# Patient Record
Sex: Female | Born: 1960 | Race: White | Hispanic: No | Smoking: Former smoker
Health system: Southern US, Community
[De-identification: ages and names within clinical notes are randomized; demographics above are authoritative.]

## PROBLEM LIST (undated history)

## (undated) DIAGNOSIS — M5430 Sciatica, unspecified side: Secondary | ICD-10-CM

## (undated) DIAGNOSIS — F419 Anxiety disorder, unspecified: Secondary | ICD-10-CM

## (undated) HISTORY — PX: CARPAL TUNNEL RELEASE: SHX101

## (undated) HISTORY — DX: Anxiety disorder, unspecified: F41.9

## (undated) HISTORY — PX: LIPOMA EXCISION: SHX5283

## (undated) HISTORY — PX: CHOLECYSTECTOMY: SHX55

---

## 2001-05-30 ENCOUNTER — Other Ambulatory Visit: Admission: RE | Admit: 2001-05-30 | Discharge: 2001-05-30 | Payer: Self-pay | Admitting: Internal Medicine

## 2001-06-04 ENCOUNTER — Ambulatory Visit (HOSPITAL_COMMUNITY): Admission: RE | Admit: 2001-06-04 | Discharge: 2001-06-04 | Payer: Self-pay | Admitting: Internal Medicine

## 2001-06-04 ENCOUNTER — Encounter: Payer: Self-pay | Admitting: Internal Medicine

## 2004-02-12 ENCOUNTER — Emergency Department (HOSPITAL_COMMUNITY): Admission: EM | Admit: 2004-02-12 | Discharge: 2004-02-12 | Payer: Self-pay | Admitting: *Deleted

## 2004-07-19 ENCOUNTER — Other Ambulatory Visit: Admission: RE | Admit: 2004-07-19 | Discharge: 2004-07-19 | Payer: Self-pay | Admitting: Internal Medicine

## 2004-07-28 ENCOUNTER — Ambulatory Visit (HOSPITAL_COMMUNITY): Admission: RE | Admit: 2004-07-28 | Discharge: 2004-07-28 | Payer: Self-pay | Admitting: Internal Medicine

## 2004-12-06 ENCOUNTER — Ambulatory Visit (HOSPITAL_COMMUNITY): Admission: RE | Admit: 2004-12-06 | Discharge: 2004-12-06 | Payer: Self-pay | Admitting: Otolaryngology

## 2005-12-26 ENCOUNTER — Ambulatory Visit: Admission: RE | Admit: 2005-12-26 | Discharge: 2005-12-26 | Payer: Self-pay | Admitting: Gynecologic Oncology

## 2006-01-09 ENCOUNTER — Ambulatory Visit (HOSPITAL_COMMUNITY): Admission: RE | Admit: 2006-01-09 | Discharge: 2006-01-09 | Payer: Self-pay | Admitting: Gynecologic Oncology

## 2006-01-30 ENCOUNTER — Ambulatory Visit: Admission: RE | Admit: 2006-01-30 | Discharge: 2006-01-30 | Payer: Self-pay | Admitting: Gynecologic Oncology

## 2006-12-25 ENCOUNTER — Inpatient Hospital Stay (HOSPITAL_COMMUNITY): Admission: EM | Admit: 2006-12-25 | Discharge: 2006-12-27 | Payer: Self-pay | Admitting: Emergency Medicine

## 2006-12-25 ENCOUNTER — Ambulatory Visit: Payer: Self-pay | Admitting: Cardiology

## 2006-12-26 ENCOUNTER — Ambulatory Visit: Payer: Self-pay | Admitting: Cardiology

## 2007-01-23 ENCOUNTER — Encounter (HOSPITAL_COMMUNITY): Admission: RE | Admit: 2007-01-23 | Discharge: 2007-02-22 | Payer: Self-pay | Admitting: Internal Medicine

## 2007-02-02 ENCOUNTER — Emergency Department (HOSPITAL_COMMUNITY): Admission: EM | Admit: 2007-02-02 | Discharge: 2007-02-02 | Payer: Self-pay | Admitting: Emergency Medicine

## 2007-02-11 ENCOUNTER — Ambulatory Visit: Payer: Self-pay | Admitting: Cardiology

## 2007-03-01 ENCOUNTER — Ambulatory Visit: Payer: Self-pay | Admitting: Internal Medicine

## 2007-03-07 ENCOUNTER — Ambulatory Visit (HOSPITAL_COMMUNITY): Admission: RE | Admit: 2007-03-07 | Discharge: 2007-03-07 | Payer: Self-pay | Admitting: Internal Medicine

## 2007-03-07 ENCOUNTER — Ambulatory Visit: Payer: Self-pay | Admitting: Internal Medicine

## 2007-10-10 ENCOUNTER — Ambulatory Visit: Payer: Self-pay | Admitting: Cardiology

## 2008-02-10 ENCOUNTER — Ambulatory Visit: Payer: Self-pay | Admitting: Internal Medicine

## 2008-03-02 ENCOUNTER — Encounter: Payer: Self-pay | Admitting: Internal Medicine

## 2008-03-02 ENCOUNTER — Ambulatory Visit: Payer: Self-pay | Admitting: Internal Medicine

## 2008-03-02 ENCOUNTER — Ambulatory Visit (HOSPITAL_COMMUNITY): Admission: RE | Admit: 2008-03-02 | Discharge: 2008-03-02 | Payer: Self-pay | Admitting: Internal Medicine

## 2009-05-01 IMAGING — CT CT ANGIO CHEST
2 of 5 series · 18 of 32 positions shown · non-contrast
Comparison: none

HISTORY: Chest pain, dyspnea, question aortic dissection

[Series 9: lung · axial · 0.69mm/px · z∈[-200,-161]mm · 2 of 88 slices shown]
[im 44/88  mediastinal]
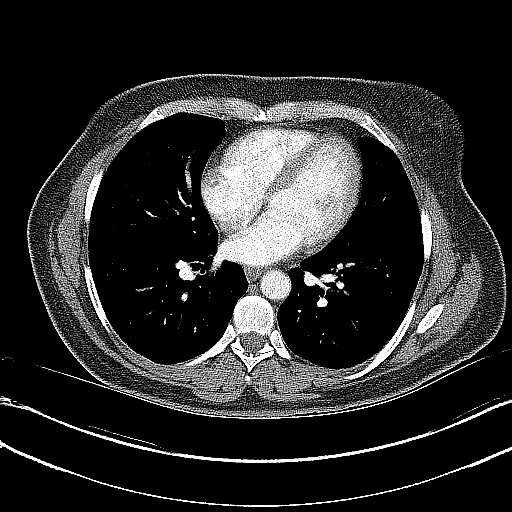
[im 57/88  mediastinal]
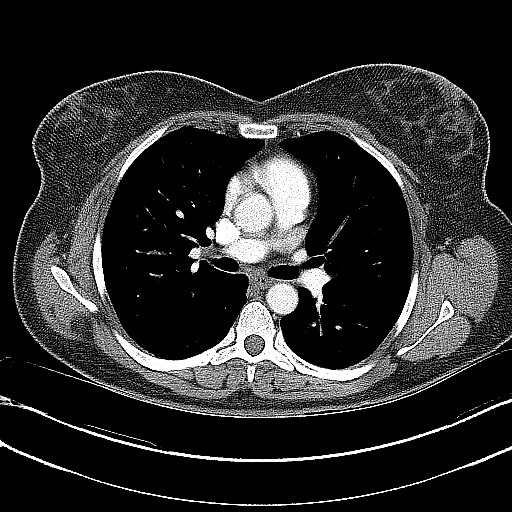

[Series 13: pacs thin · axial · 0.69mm/px · z∈[-500,-86]mm · 16 of 593 slices shown]
[im 38/593  lung]
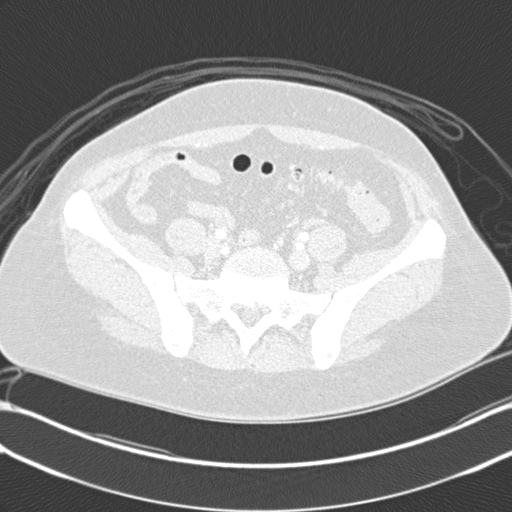
[im 75/593  mediastinal]
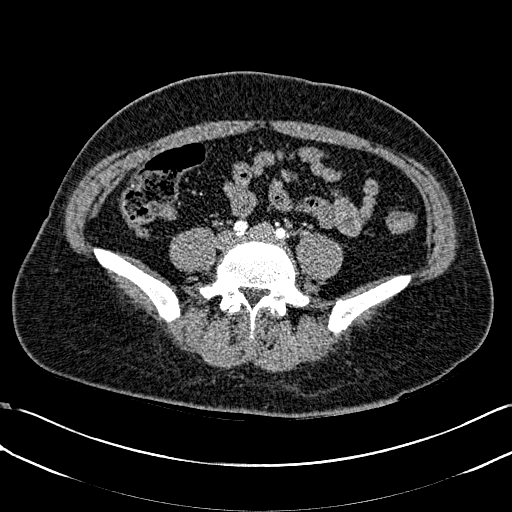
[im 112/593  lung]
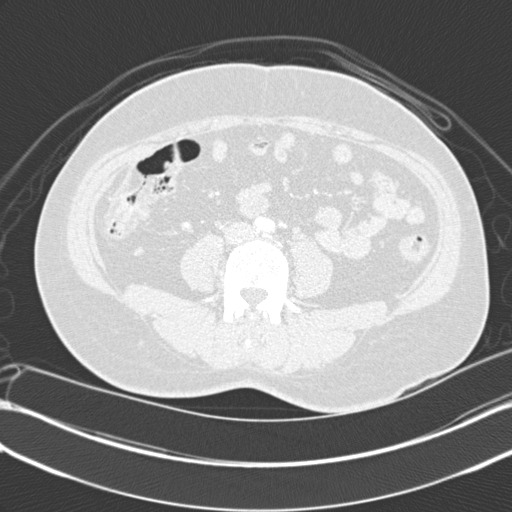
[im 149/593  mediastinal]
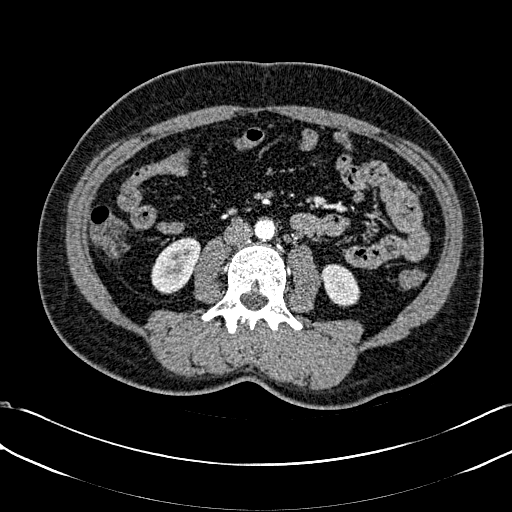
[im 186/593  lung]
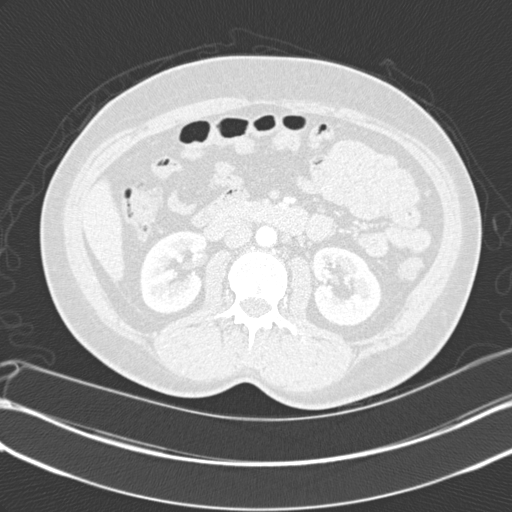
[im 223/593  mediastinal]
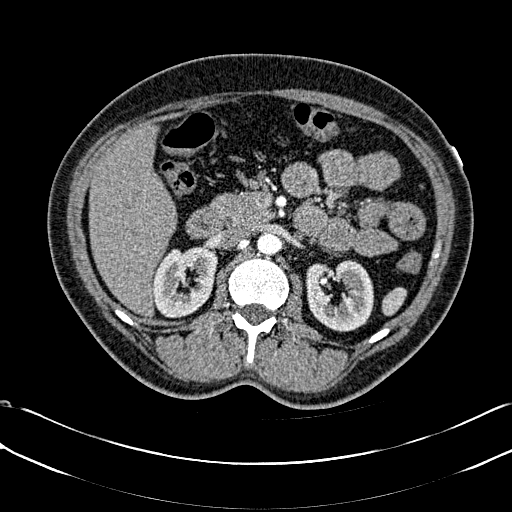
[im 260/593  lung]
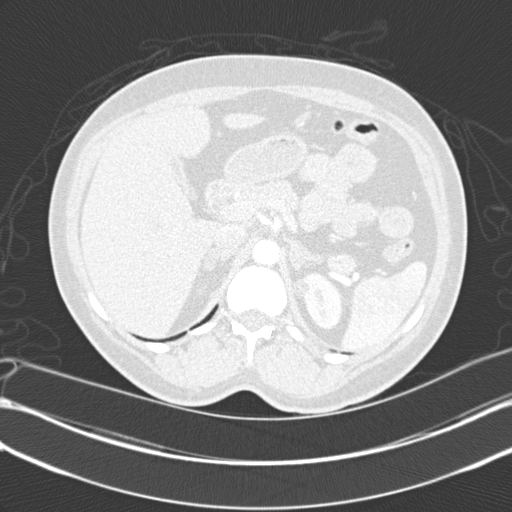
[im 297/593  mediastinal]
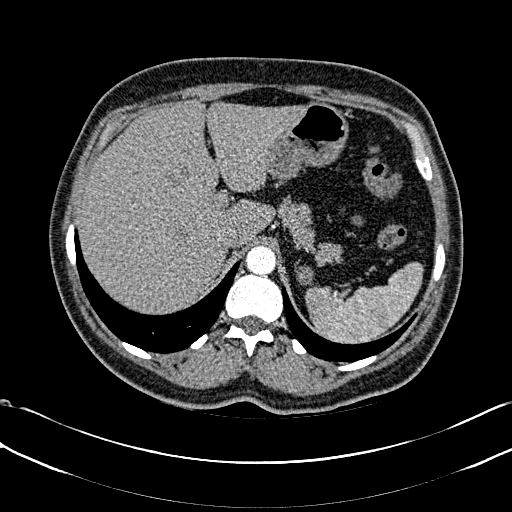
[im 334/593  lung]
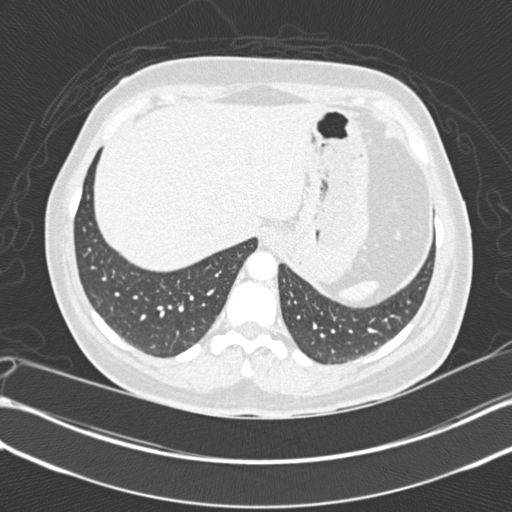
[im 371/593  mediastinal]
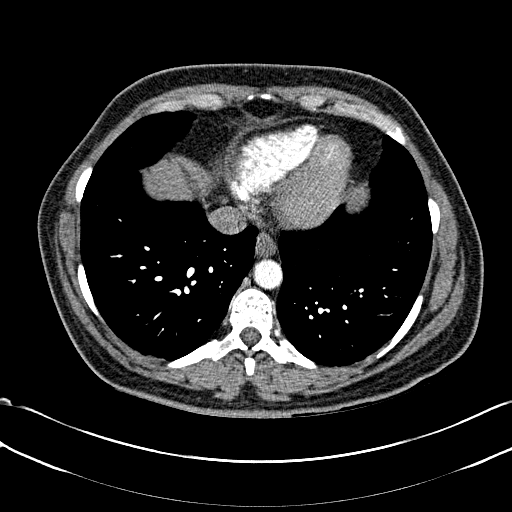
[im 408/593  lung]
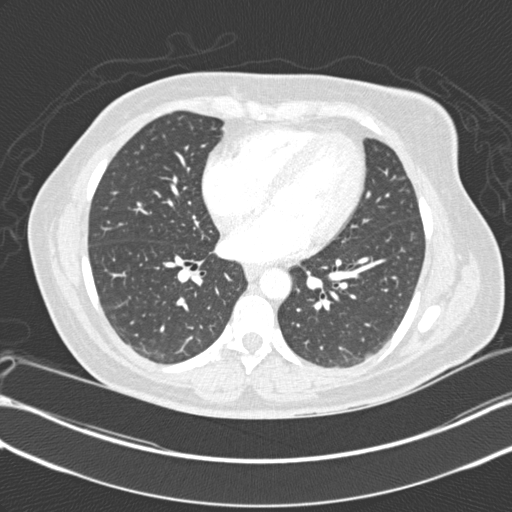
[im 445/593  mediastinal]
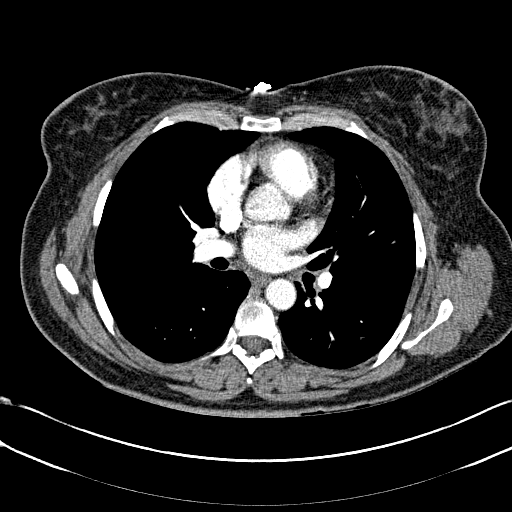
[im 461/593  lung]
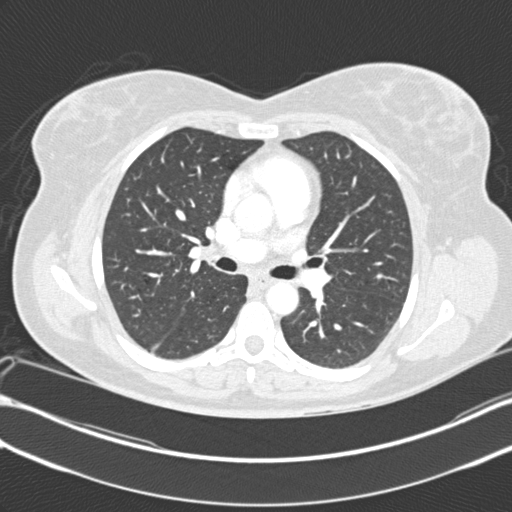
[im 482/593  mediastinal]
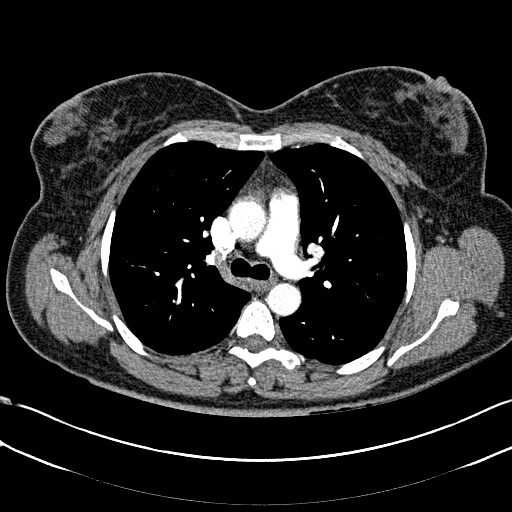
[im 519/593  lung]
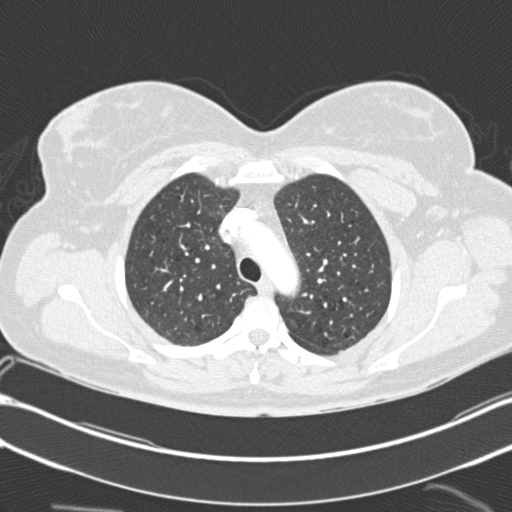
[im 556/593  mediastinal]
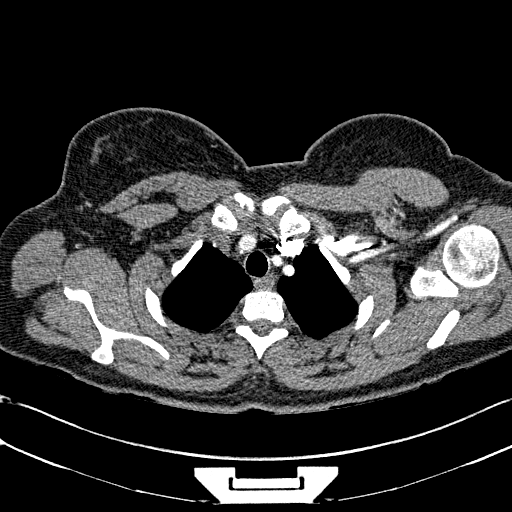

[18 of 32 positions shown; findings below may reference images not displayed]

CT ANGIO CHEST AND ABDOMEN WITH AND WITHOUT CONTRAST:

Multidetector helical CT imaging of chest and abdomen performed using aortic
dissection protocol.
Exam utilized 120 cc Qmnipaque-FVZ.
Examination includes pre- and postcontrast images as well as postcontrast
sagittal and coronal images.
No prior exam for comparison.

CT ANGIO CHEST:

No high attenuation within the aortic wall on precontrast images.
Normal aortic opacification on postcontrast study without aortic aneurysm or
dissection.
Pulmonary arteries patent without pulmonary embolism.
Minimal stranding anterior mediastinum likely related to residual thymus.
No thoracic adenopathy.
Minimal dependent atelectasis and lungs.
Lungs otherwise clear.
Bones unremarkable.
IMPRESSION: No acute thoracic abnormalities.

CT ANGIO ABDOMEN:

No intramural hematoma seen on precontrast images.
Mild scattered atherosclerotic calcification abdominal aorta.
Aorta patent and normal in caliber on postcontrast images.
No evidence of aortic aneurysm or dissection.
Origins of the celiac, superior mesenteric, inferior mesenteric, and renal
arteries appear patent.
Single renal arteries seen bilaterally.
Tiny cyst upper pole left kidney 12 mm diameter image 91.
Slightly thickened adrenal glands bilaterally without mass or nodule.
Few descending colonic diverticula.
Remaining solid organs and bowel loops in upper abdomen unremarkable for exam
lacking GI contrast.
No mass, adenopathy, free fluid, or inflammatory process.
IMPRESSION: Mild atherosclerotic disease.
Tiny left renal cyst.
Otherwise negative CT angio abdomen.

## 2010-05-15 ENCOUNTER — Encounter: Payer: Self-pay | Admitting: Otolaryngology

## 2010-09-06 NOTE — Group Therapy Note (Signed)
[name], [name]               ACCOUNT NO.:  [id number]   MEDICAL RECORD NO.:  [id number]          PATIENT TYPE:  INP   LOCATION:  A206                          FACILITY:  [facility]   PHYSICIAN:  Osvaldo Shipper, MD     DATE OF BIRTH:  02-Jul-1960   DATE OF PROCEDURE:  12/26/2006  DATE OF DISCHARGE:                                 PROGRESS NOTE   SUBJECTIVE:  The patient is complaining of some chest tightness  radiating to the back.  She mentions, although, that she has had this  feeling on and off for a month.  She mentioned that symptoms kind of  increased in intensity whenever she has her period.  She is having  periods twice a month now.  She mentions that she could be going through  menopause.  She also has diaphoresis with the chest pain, some shortness  of breath, but no palpitations.  She does not mention any history of  heartburn.  The pain does radiate to the back.  It is about a 3 to 4 out  of 10 in intensity.  It is more like a chest tightness.  It also  radiates to the neck and the left arm.   OBJECTIVE:  Her vital signs today show her temperature to be 98.8, heart  rate 81, respiratory rate 18, blood pressure 99/61, and saturation as  99% on room air.  Telemetry shows that she is in normal sinus rhythm, no  arrhythmia is noted.  Her lungs are clear to auscultation bilaterally.  CARDIOVASCULAR:  S1, S2 is normal, regular, no murmurs appreciated.  No  S3, S4, no bruits heard.  ABDOMEN:  Shows a slight tenderness in the epigastric area, but nothing  in the right upper quadrant.  Mostly soft, no rebound residual guarding.  EXTREMITIES:  Without edema.  Peripheral pulses are palpable.   LABORATORY DATA:  Her labs are completely unremarkable, actually, except  for mildly elevated white count yesterday at 11.3.  D-dimer was normal,  CMET was unremarkable, lipase was normal, cardiac markers negative,  urine pregnancy negative, urinalysis negative.   Chest x-ray was  unremarkable.  Her EKG shows a nonspecific T-wave change  in lead III, otherwise a normal EKG.  No changes noted on 2 EKGs  performed about 12 hours apart.   ASSESSMENT/PLAN:  1. Chest pain.  Again, atypical for cardiac etiology.  Because of the      pain going to the back, I think we need to rule out aortic      dissection, though she does not have any risk factors, really.  I      think we will go ahead and do that and rule that out.  We will get      a TSH checked, get a 2D echocardiogram.  The patient mentioned that      she has had similar symptoms back in 2001 for which she was      admitted here and underwent stress test.  I do not see those      reports in the E-chart, but will request medical  records to pull      those out and we can review them and see what kind of testing she,      indeed, had.  If the echocardiogram report is normal, and if CT is      negative, I think this patient can probably go home and have      outpatient cardiology evaluation.  If, on the other hand, the echo      shows abnormalities, or if the CT is abnormal, we will have to      address those issues.  She continues to be on deep vein thrombosis      gastrointestinal prophylaxis.  She really does not have any other      medical issues as far      as we know.  She was not on any medications at home.  2. Hence, plan on this patient is to get a CAT scan of her chest.      Will rule out dissection.  Get an echocardiogram and repeat some      labs tomorrow, and I think if the above tests are negative, she      can, potentially, go home.      Osvaldo Shipper, MD  Electronically Signed     GK/MEDQ  D:  12/26/2006  T:  12/26/2006  Job:  [id number]

## 2010-09-06 NOTE — Consult Note (Signed)
[name], [name]               ACCOUNT NO.:  [id number]   MEDICAL RECORD NO.:  [id number]          PATIENT TYPE:  END   LOCATION:  DAY                           FACILITY:  [facility]   PHYSICIAN:  R. Roetta Sessions, M.D. DATE OF BIRTH:  [birth year]   DATE OF CONSULTATION:  03/07/2007  DATE OF DISCHARGE:                                 CONSULTATION   REQUESTING PHYSICIAN:  Dr. Sherril Croon   REASON FOR CONSULTATION:  Atypical chest pain and epigastric pain.   HISTORY OF PRESENT ILLNESS:  [name] is a 50 year old Caucasian  female.  Approximately 2 months ago she developed acute onset of severe  heaviness and chest tightening.  This was associated with numbness and  tingling in her extremities.  She then developed epigastric pain.  She  was admitted to [facility]  She was evaluated by Dr. Dietrich Pates.  It was felt that her symptoms were not related to a cardiac origin.  She  had an abdominal ultrasound which was normal.  This was followed by a  HIDA scan which was done a month later and was shown to be abnormal.  She had an ejection fraction of 38%.  She had her second episode of this  pain just prior to the HIDA scan.  She has only had two episodes which  she describes as severe and led her into a panic-type mode.  She has  been taking omeprazole 20 mg daily for about 4 weeks now.  She has been  adhering to a strict diet and trying to avoid these episodes.  She  occasionally feels gas build up in her epigastrium and chest.  Belching  does seem to make this better.  She denies any heartburn or indigestion  on a daily basis.  She has noticed both liquids and solids occasionally  getting stuck and points to her upper esophagus.  She has had these  type symptoms for about 10 years now.   She had a barium pill esophagram back 2 years ago which showed a small  hiatal hernia with some distal esophageal spasm causing temporary delay  in passage of the tablet, as well as some transitory  tertiary  contractions in the distal esophagus and a slight delay in gastric  emptying.   PAST MEDICAL AND SURGICAL HISTORY:  1. Right hand carpal tunnel release.  2. Hypertriglyceridemia.   CURRENT MEDICATIONS:  1. Omeprazole 20 grams daily.  2. Xanax 0.5 mg daily p.r.n.   ALLERGIES:  No known drug allergies.   FAMILY HISTORY:  There is no known family history of colorectal  carcinoma, liver or chronic GI problems.  Mother has history of  cardiomyopathy and COPD.  Father deceased at age 40 due to lung cancer.  She has one healthy brother.   SOCIAL HISTORY:  [name] has been married for 8 years.  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  She has  a 15 pack-year history of tobacco use.  Denies any alcohol or drug use.   REVIEW OF SYSTEMS:  See HPI.  PULMONARY:  She denies any cough  or  hemoptysis.  GI:  See HPI.  She generally has a soft brown daily bowel  movement.  Denies any rectal bleeding or melena.  She describes her  stools as loose.  GU:  Denies any dysuria, hematuria, increased urinary  frequency.  GYN:  Last menstrual period was November 4.  She has had  irregular menses.  She recently saw her gynecologist for a complete  workup.  She had normal pelvic ultrasound per her report.   PHYSICAL EXAMINATION:  VITAL SIGNS:  Weight 131 pounds, height 60  inches, temperature 98.4, blood pressure 110/70 and pulse 76.  GENERAL:  [name] is a well-developed, well-nourished Caucasian  female in no acute distress.  HEENT.  Sclerae clear, nonicteric.  Conjunctivae pink.  Oropharynx pink  and moist without any lesions.  NECK:  Supple without any mass or thyromegaly.  CHEST:  Heart regular rate and rhythm with normal S1, S2, without any  murmurs, clicks, rubs or gallops.  LUNGS:  Clear to auscultation bilaterally.  ABDOMEN:  Positive bowel sounds x4.  No bruits auscultated.  Soft,  nontender, nondistended, negative Murphy sign.  No rebound  tenderness or  guarding.  No hepatosplenomegaly or mass.  EXTREMITIES:  Without clubbing or edema bilaterally.  SKIN:  Pink, warm and dry without any rash or jaundice.   IMPRESSION:  [name] is a 50 year old Caucasian female with two  episodes of severe atypical chest pain, as well as epigastric pain.  She  has had cardiac evaluation which was normal by Dr. Dietrich Pates.  She does  have biliary dyskinesia on recent HIDA scan.  Although her symptoms are  not typical for GERD it is possible she could have an element of this.  Also, her dysphagia is going to require further evaluation to rule out  esophageal web, ring or stricture.   PLAN:  1. EGD with possible esophageal dilatation with Dr. Jena Gauss in the near      future.  I discussed this procedure including risks and benefits      which include but are not limited to bleeding, infection,      perforation, drug reaction.  She agrees to the plan.  2. Omeprazole 20 mg daily.  3. If EGD is normal would consider surgical evaluation to discuss      cholecystectomy.   We would like to thank Dr. Sherril Croon for allowing [place] to participate in the  care of [name].      Lorenza Burton, N.P.      Jonathon Bellows, M.D.  Electronically Signed    KJ/MEDQ  D:  03/01/2007  T:  03/01/2007  Job:  [id number]   cc:   Doreen Beam  Fax: [phone]

## 2010-09-06 NOTE — H&P (Signed)
[name], [name]               ACCOUNT NO.:  [id number]   MEDICAL RECORD NO.:  [id number]          PATIENT TYPE:  INP   LOCATION:  A206                          FACILITY:  [facility]   PHYSICIAN:  Marcello Moores, MD   DATE OF BIRTH:  [birth year]   DATE OF ADMISSION:  12/25/2006  DATE OF DISCHARGE:  LH                              HISTORY & PHYSICAL   CHIEF COMPLAINT:  Intermittent chest pain for the last 1 month.   HISTORY OF PRESENT ILLNESS:  Ms. Huston is a 50 year old female patient  with no significant past medical history except she is a chronic smoker,  who presented with the intermittent chest pain which is pressure-type on  the anterior chest.  She had associated burning type of chest pain as  well.  She denied any vomiting.  She denied any palpitation.  She denied  any radiation to any part of the body but she stated that associated  with the anterior pressure-type she has also back pain which is 5/10 in  scale.  Otherwise she has not any urinary tract complaints.  No  pulmonary complaints.  She has abdominal discomfort with loose stool,  which was intermittent as well.  She smokes one pack per day since her  childhood.   REVIEW OF SYSTEMS:  A 10-point review of systems is noncontributory.   ALLERGIES:  She has not any known drug allergies.   PAST MEDICAL HISTORY:  None.   HOME MEDICATIONS:  None.   PHYSICAL EXAMINATION:  The patient is lying on the bed without any  cardiopulmonary distress.  VITAL SIGNS:  Temperature is 97.7, pulse is 84 and respiratory rate is16  and the blood pressure is 130/80 and O2 saturation 98% on range of  motion air.  HEENT:  She has pink conjunctivae, nonicteric sclerae.  NECK:  Supple.  CHEST:  She has good air entry bilateral.  CVS:  S1-S2 well-heard.  No murmur is appreciated.  ABDOMEN:  Soft, obese, and there is no area of tenderness.  EXTREMITIES:  No pedal or peripheral edema.  Peripheral pulses were  positive.  CNS:  She is alert  and well-oriented.  There is not any new neurological  deficit.   LABS:  White blood cell is at 11.3 and hemoglobin is 14 and hematocrit  is 42 and platelet count is 304.  Chemistry:  Sodium is 141, potassium  is 4.1, chloride is 109, bicarb is 27, glucose is 96 and BUN is five,  creatinine is 0.6.  liver function tests within normal range.  Cardiac  markers:  Troponin is less than 0.3 and CK-MB is 0.7.  Urinalysis is  negative.  EKG is normal sinus rhythm at 68 per minute and there is not  any A to E change.   ASSESSMENT:  Chest pain, atypical and without any risky factor except  chronic smoking.  Will do serial cardiac enzymes and EKG and will do  also echocardiogram.  She is complaining of back pain and will do also a  CT scan of the chest.  If there is any elevation of cardiac markers or  any change in the EKG or any continuation of her  discomfort, probably we might need to consult a cardiologist as well.  Otherwise the patient is stable and we will follow her with her cardiac  markers.  She will be admitted with aspirin as well as with  nitroglycerin, and she will be put on DVT prophylaxis.      Marcello Moores, MD  Electronically Signed     MT/MEDQ  D:  12/26/2006  T:  12/26/2006  Job:  [id number]

## 2010-09-06 NOTE — H&P (Signed)
[name], [name]               ACCOUNT NO.:  [id number]   MEDICAL RECORD NO.:  [id number]          PATIENT TYPE:  AMB   LOCATION:  DAY                           FACILITY:  [facility]   PHYSICIAN:  R. Roetta Sessions, M.D. DATE OF BIRTH:  1960-07-17   DATE OF ADMISSION:  DATE OF DISCHARGE:  LH                              HISTORY & PHYSICAL   PRIMARY CARE PHYSICIAN:  Kirstie Peri, MD.   CHIEF COMPLAINT:  Bloody diarrhea.   HISTORY OF PRESENT ILLNESS:  Ms. [name] is a 50 year old Caucasian  female.  She has been on antibiotics for 2 weeks for otitis.  She began  to develop diarrhea this morning.  She has been noticing large amounts  of bright red blood mixing with mucus in her stool.  She denies any  history of blood in her stools prior to the onset of this.  Since her  cholecystectomy, she has had postprandial urgency and explosive loose  stools, but usually only once per day.  She denies any fever or chills.  Denies any nausea or vomiting.  Denies any foreign travel, new  medications other than antibiotics, and/or new pets.  She has been  taking Aleve once daily for the last 3 days for myalgias in her upper  thighs.  She denies any problems with heartburn, indigestion, dysphagia,  or odynophagia.  She has had some right upper quadrant pain which has  been intermittent over the last 2 weeks.  She does have history of  taking occasional BC powders, but has not taken any recently.  Her  weight had remained stable.   PAST MEDICAL AND SURGICAL HISTORY:  She has previously been found to  have an H. Pylori.  1. This was to be repeated in 1 month approximately a year ago by Dr.      Jena Gauss; however, this was never followed up on.  2. She had an EGD by Dr. Jena Gauss.  On March 07, 2007, she was found      to have thin plaque on the distal esophagus, which is not      clinically significant.  Biopsies were benign.  She has a small      hiatal hernia and marked nodular erosive duodenitis  involving the      bulb and proximal second portion.  She has history of      hypercholesterolemia and is status post cholecystectomy in December      2008 for biliary dyskinesia.  3. She has had right hand carpal tunnel release.   CURRENT MEDICATIONS:  1. Aleve once daily.  2. Omeprazole 20 mg p.r.n.  3. Xanax 0.5 mg p.r.n.  4. Goody's or BC powders occasionally.   ALLERGIES:  No known drug allergies.   FAMILY HISTORY:  There is no known family history of colorectal  carcinoma, inflammatory bowel disease, liver, or chronic GI problems.  Her mother has history of cardiomyopathy and COPD.  Father deceased at  65 due to lung carcinoma.  She has one healthy brother.   SOCIAL HISTORY:  Ms. [name] has been married for 9 years.  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  She has  16-pack-year history of tobacco use.  She denies alcohol or drug use.   REVIEW OF SYSTEMS:  See HPI, otherwise negative.   PHYSICAL EXAMINATION:  VITAL SIGNS:  Weight 140 pounds, height 60  inches, temperature 98.1, blood pressure 130/84, and pulse 80.  GENERAL:  [name] is a well-developed and well-nourished Caucasian  female in no acute distress.  HEENT:  Sclerae clear, nonicteric.  Conjunctivae pink.  Oropharynx pink  and moist without any lesions.  NECK:  Supple without mass or thyromegaly.  CHEST:  Heart regular rate and rhythm.  Normal S1 and S2.  No murmurs,  clicks, rubs or gallops.  LUNGS:  Clear to auscultation bilaterally.  ABDOMEN:  Positive bowel sounds x4.  No bruits auscultated.  Soft,  nontender, and nondistended without palpable mass or hepatosplenomegaly.  No rebound, tenderness, or guarding.  EXTREMITIES:  Without clubbing or edema.  SKIN:  Pink, warm, and dry without any rash or jaundice.   IMPRESSION:  Ms. [name] is a 50 year old Caucasian female with acute  onset of bloody diarrhea.  She has been on recent antibiotics as well as   anti-inflammatories, which I suspect is the culprit of her symptoms.  Differentials include NSAID-induced ischemic colitis, inflammatory bowel  disease, benign anorectal source including hemorrhoids or fissure, and  less likely colorectal neoplasia.   She was supposed to have followup on her mildly positive Helicobacter  pylori which was never treated.  This has not been done and we will  pursue this at the same time.   PLAN:  1. Check serum H. pylori, MET-7, and CBC.  2. Avoid Aleve and discontinue antibiotics as she should be through      with this course anyways.  3. Stools for ova and parasites, culture and sensitivity, C. diff, and      lactoferrin.  4. Sustenex 1 p.o. daily, I have given her samples.  5. Colonoscopy with Dr. Jena Gauss in the near future.  We discussed the      procedure including risks and benefits which include but are not      limited to bleeding, infection, perforation, and drug reaction.      She agrees to plan and consent will be obtained.      Lorenza Burton, N.P.      Jonathon Bellows, M.D.  Electronically Signed    KJ/MEDQ  D:  02/10/2008  T:  02/11/2008  Job:  [id number]   cc:   Kirstie Peri, MD  Fax: [phone]

## 2010-09-06 NOTE — Assessment & Plan Note (Signed)
[facility]                          [facility] OFFICE NOTE   NAME:[name], [name]                      MRN:          [id number]  DATE:02/11/2007                            DOB:          05/24/1960    REASON FOR VISIT:  Post hospital followup.   HISTORY OF PRESENT ILLNESS:  Ms. Guyett is a pleasant 50 year old  female with no documented history of coronary artery disease, but  several cardiac risk factors with a prior negative stress test in 2001.  She was recently hospitalized at [facility] in early September  with chest pain.  She was referred to Dr. [place] Bing for  consultation, who felt that her symptoms were quite atypical. He noted  she had normal  serial cardiac markers as well as a D-dimer level.  He  also reviewed a 2-D echocardiogram, again entirely within normal limits,  and concluded that her symptoms were noncardiac in etiology.  He  recommended further evaluation with a formal GI consultation, and  patient reports today that she is planning on establishing with Dr.  Karilyn Cota in the next several weeks.   Dr. Dietrich Pates did also suggest consideration of an exercise stress test,  if patient were to have persistent symptoms.   The patient now presents with this several week history of near daily  chest pain, which is not precipitated by exertion nor exacerbated by it.  In fact, she points out she is able to walk her dogs at a brisk pace  with no associated chest discomfort or significant dyspnea.  She can  also easily climb a flight of stairs with no chest pain as well.  Her  symptoms are quite unpredictable in onset and she reported having chest  pain during our current evaluation as well.  She also cites partial  relief of her symptoms with belching and suggests symptoms of dysphagia  during meals.   CLINICAL DATA:  An electrocardiogram today reveals normal sinus rhythm  at 78 beats per minute with normal axis and no  ischemic changes.   CURRENT MEDICATIONS:  1. Xanax 0.5 t.i.d.  2. Omeprazole 20 daily.   PHYSICAL EXAMINATION:  VITAL SIGNS:  Blood pressure currently 126/76,  pulse 81. Weight 143.  GENERAL APPEARANCE:  A 50 year old female sitting upright in no  distress.  HEENT:  Normocephalic, atraumatic.  NECK:  Palpable bilateral carotid pulses without bruits, no JVD.  LUNGS:  Clear to auscultation bilaterally.  HEART:  Regular rate and rhythm (S1, S2).  No significant murmur.  ABDOMEN:  Benign.  Nontender with intact bowel sounds.  EXTREMITIES:  No pedal edema.  NEUROLOGICAL:  No focal deficits.   IMPRESSION:  1. Atypical chest pain.      a.     Recent hospitalization with normal cardiac markers.      b.     Recent normal 2-dimensional echocardiogram.      c.     Reported negative stress test in 2001.  2. Multiple cardiac risk factors.      a.     Tobacco.      b.  Dyslipidemia.      c.     Family history.  3. Dysphagia.   PLAN:  Following review with Dr. Andee Lineman, the recommendation is to  proceed with a repeat stress test consisting of a routine treadmill test  for risk stratification.  We suspect that this will be negative for  evidence of ischemia, as initially suggested by Dr. Dietrich Pates during the  patient's recent hospitalization at [facility].  If the  treadmill test is negative, then no further cardiac work up is indicated  and the patient may return on an as-needed basis.  Recommendation would  therefore be for her to pursue further work up for a GI etiology for her  chest discomfort, with Dr. Karilyn Cota.      Gene Serpe, PA-C  Electronically Signed      Learta Codding, MD,FACC  Electronically Signed   GS/MedQ  DD: 02/11/2007  DT: 02/12/2007  Job #: [id number]   cc:   Kirstie Peri, MD

## 2010-09-06 NOTE — Discharge Summary (Signed)
[name], [name]               ACCOUNT NO.:  [id number]   MEDICAL RECORD NO.:  [id number]          PATIENT TYPE:  INP   LOCATION:  A206                          FACILITY:  [facility]   PHYSICIAN:  Skeet Latch, DO    DATE OF BIRTH:  12-04-1960   DATE OF ADMISSION:  12/25/2006  DATE OF DISCHARGE:  09/04/2008LH                               DISCHARGE SUMMARY   DISCHARGE DIAGNOSIS:  Atypical chest pain.   BRIEF HOSPITAL COURSE:  [name] is a 50 year old Caucasian female  with no significant past medical history who is a tobacco abuser who  presented with intermittent chest pain with pressure type sensation in  the anterior part of her chest.  The patient has associated burning type  of pain but denied any abdominal pain, vomiting or palpitations.  The  patient stated the pain was a 5/10 at its worst.  The patient denied any  shortness of breath during this episode.  The patient presented with  above symptoms and was admitted for chest pain.  The patient had a CT  angiogram of abdomen and chest.  Her angiogram of her chest was  unremarkable.  Angiogram of her abdomen showed (1) mild atherosclerotic  disease, (2) a tiny left renal cyst.  The patient did have an  echocardiogram performed also which showed a left atrial size at upper  limits of normal, normal right atrium and ventricle, normal aortic and  pulmonary valve.  Ejection fraction was not given on echocardiogram.  The patient did mentioned above some head discomfort and also had a CT  scan of her head done without contrast which was unremarkable.  She also  had an ultrasound of her carotid arteries which showed early left  carotid plaque without evidence of hemodynamically significant stenosis,  continued surveillance was recommended.  Cardiology was consulted  regarding the patient's chest pain and it was believed that her symptoms  were more suggestive of a GT etiology.  It was recommended the patient  be placed on a PPI  twice a day and be referred to gastroenterologist.  It was recommended that the patient have an outpatient stress test and  have her lipids checked as an outpatient.  Also the patient could  probably be reassessed in the office in approximately one month.  Now  patient is chest pain-free, has no complaints and is ready for  discharge.   LABS ON DISCHARGE:  TSH 3.49, sodium 140, potassium 4.1, chloride 112,  CO2 26, glucose 98, BUN 10, creatinine 0.68.  The patient had negative  troponins.  Urine pregnancy test was negative.  Negative urinalysis.  Lipase was 18.  D-dimer 0.25.   VITALS ON DISCHARGE:  Temperature 98.6, pulse 72, respirations 18, blood  pressure 117/66, sating 96% on room air.   MEDICATION ON DISCHARGE:  Omeprazole 20 mg 1 tablet p.o. b.i.d.   DIET:  The patient is to maintain a regular diet, low salt.   ACTIVITY:  The patient is to increase activity to regular as previous.   FOLLOW UP:  The patient is to follow up with her primary care physician  which is in [place], [place], Dr. Sherril Croon, in approximately one week.  I will defer to Dr. Sherril Croon regarding patient being sent to a  gastroenterologist.  The patient probably needs to be seen by [facility] in approximately one month for follow-up and possible  outpatient stress test.   INSTRUCTIONS:  The patient is instructed to return to the emergency room  if recurring chest discomfort or chest pain.      Skeet Latch, DO  Electronically Signed     SM/MEDQ  D:  12/27/2006  T:  12/27/2006  Job:  [id number]   cc:   Doreen Beam  Fax: [phone]

## 2010-09-06 NOTE — Consult Note (Signed)
[name], [name]               ACCOUNT NO.:  [id number]   MEDICAL RECORD NO.:  [id number]          PATIENT TYPE:  INP   LOCATION:  A206                          FACILITY:  [facility]   PHYSICIAN:  Gerrit Friends. Dietrich Pates, MD, FACCDATE OF BIRTH:  Nov 15, 1960   DATE OF CONSULTATION:  12/27/2006  DATE OF DISCHARGE:  12/27/2006                                 CONSULTATION   PRIMARY CARE PHYSICIAN:  Dr. Sherril Croon in [place].   PRIMARY GASTROENTEROLOGIST:  Dr. Karilyn Cota   HISTORY OF PRESENT ILLNESS:  A 50 year old woman referred for  consultation concerning chest pain.  [name] has no history of  cardiovascular disease.  She had a negative stress test in 2001 and a  negative echocardiogram in the past.  Risk factors include only  cigarette smoking.  She presents with intermittent chest pressure,  particularly noted postprandial.  There is no relationship to exertion.  The intensity is mild to moderate.  The discomfort has a burning quality  and does not radiate.  There is no associated dyspnea nor diaphoresis.  She has not found anything that can alleviate or exacerbate her  discomfort.  Symptoms have been present only during the most recent few  weeks.   PAST MEDICAL HISTORY:  Is benign.  The patient has never been  hospitalized nor undergone any significant surgery.   She is taking no medications routinely.  She has no known drug  allergies.   SOCIAL HISTORY:  Married and lives locally; no use of tobacco products  nor alcohol.   FAMILY HISTORY:  No prominent coronary disease.   REVIEW OF SYSTEMS:  Regular diet; all other systems reviewed and are  negative.   EXAMINATION:  Pleasant, well-appearing woman.  The temperature is 98,  heart rate 85 and regular, respirations 16, blood pressure 130/80, O2  saturation 90% on room air.  HEENT:  Anicteric sclerae; normal lids and conjunctivae.  NECK:  No jugular venous distention; normal carotid upstrokes without  bruits.  ENDOCRINE:  No  thyromegaly.  LUNGS:  Clear.  CARDIAC:  Normal first heart sounds; normally-split second heart sound.  ABDOMEN:  Soft, nontender; no organomegaly.  EXTREMITIES:  No edema; normal distal pulses.  NEUROMUSCULAR:  Symmetric strength and tone; normal cranial nerves.   Studies have included a carotid ultrasound that demonstrates minimal  carotid plaque.  A CT scan of the head was perfectly normal.  A CT scan  of the abdomen and chest shows no important abnormalities.  Minor  calcification is seen in the aorta.  There is minimal basilar  atelectasis.  Chest x-ray was normal.  EKG was normal.  Other  laboratories include a normal CBC, normal D-dimer, normal chemistry  profile, normal cardiac markers, normal TSH, and a negative pregnancy  test.   IMPRESSION:  [name]' symptoms are noncardiac and most consistent  with a gastrointestinal etiology, either gastroesophageal reflux disease  or irritable bowel syndrome.  She has been started on a PPI - this will  be dosed on a b.i.d. basis.  I would recommend reassessment by her  gastroenterologist.  She does not require additional cardiac  testing at  this time.  I will reassess this nice woman in the office in 1 month and  determine whether stress testing is warranted.   Thanks so much for allowing me to assist in her care.      Gerrit Friends. Dietrich Pates, MD, [facility]  Electronically Signed     RMR/MEDQ  D:  12/28/2006  T:  12/28/2006  Job:  [id number]

## 2010-09-06 NOTE — Procedures (Signed)
NAMEMILANY, [name]               ACCOUNT NO.:  [id number]   MEDICAL RECORD NO.:  [id number]          PATIENT TYPE:  INP   LOCATION:  A206                          FACILITY:  [facility]   PHYSICIAN:  Gerrit Friends. Dietrich Pates, MD, FACCDATE OF BIRTH:  02/27/1961   DATE OF PROCEDURE:  12/26/2006  DATE OF DISCHARGE:                                ECHOCARDIOGRAM   REFERRING PHYSICIAN:  Dr. Rito Ehrlich.   CLINICAL DATA:  A 50 year old woman with chest pain.   M-MODE:  Aorta 2.7, left atrium 3.8, septum 1.4, posterior wall 1.3, LV  diastole 3.6, LV systole 2.6.   1. Technically adequate echocardiographic study.  2. Left atrial size at the upper limit of normal; normal right atrium      and right ventricle.  3. Normal aortic valve and proximal ascending aorta.  4. Normal pulmonic valve and proximal pulmonary artery.  5. Normal mitral and tricuspid valves; physiologic tricuspid      regurgitation.  6. Normal IVC.  7. Normal internal dimension of the left ventricle; mild concentric      LVH; normal regional and global function.      Gerrit Friends. Dietrich Pates, MD, [facility]  Electronically Signed     RMR/MEDQ  D:  12/26/2006  T:  12/27/2006  Job:  [id number]

## 2010-09-06 NOTE — Op Note (Signed)
NAMETANESHA, [name]               ACCOUNT NO.:  [id number]   MEDICAL RECORD NO.:  [id number]          PATIENT TYPE:  AMB   LOCATION:  DAY                           FACILITY:  [facility]   PHYSICIAN:  R. Roetta Sessions, M.D. DATE OF BIRTH:  [birth year]   DATE OF PROCEDURE:  03/07/2007  DATE OF DISCHARGE:                               OPERATIVE REPORT   PROCEDURE:  Esophagogastroduodenoscopy with esophageal brushing.   INDICATIONS FOR PROCEDURE:  50 year old lady with atypical chest pain  with recent negative cardiac workup.  She has had some intermittent  epigastric pain, some postprandial.  She has a low gallbladder EF HIDA  scan, symptoms are somewhat suggestive of biliary etiology but she also  has symptoms suggestive of gastroesophageal reflux disease.  There is  intermittent vague dysphagia symptoms as well.  She has been on  omeprazole 20 grams orally daily for a few weeks without much difference  in improvement in her symptoms.  EGD is now being done.  Potential for  esophageal dilation were reviewed.  Risks, benefits and alternatives  have been discussed today and previously.  All questions answered.  Please see documentation in medical record.   PROCEDURE NOTE:  O2 saturation, blood pressure, pulse and monitored  throughout entire procedure.   CONSCIOUS SEDATION:  Versed 5 mg IV, Demerol mg IV divided doses.  Cetacaine spray for topical pharyngeal anesthesia.   INSTRUMENT:  Pentax video chip system.   Examination tubular esophagus revealed a thin plaquing involving distal  10 cm of tubular esophagus.  It was not really raised, curd-like plaques  consistent with which would be seen with Candida, but this did appear to  be adherent and would not easily rub off with the scope.  There was no  esophagitis, the tubular esophagus was widely patent down through the EG  junction without ring, stricture or other abnormality being seen.  There  was no feline appearance of esophagus and  there are no furrows. EG  junction easily traversed.  Stomach:  Gastric cavity was emptied, insufflated well with air.  Thorough examination of gastric mucosa including retroflex view of  proximal stomach, esophagogastric junction demonstrated a small hiatal  hernia and no other abnormalities.  Pylorus was patent and easily  traversed.  Examination of the bulb, second portion revealed marked  erosive nodular duodenitis extending just into the second portion of  duodenum without any ulcers being seen.   THERAPEUTIC/DIAGNOSTIC MANEUVERS PERFORMED:  Esophageal mucosa was  brushed for KOH prep.  The patient tolerated the procedure well, was  reacted in endoscopy.   IMPRESSION:  1. Thin plaquing distal esophagus which may not be clinically      significant, status post KOH prep, otherwise normal tubular      esophagus.  2. Small hiatal hernia otherwise normal stomach, patent pylorus,      marked nodular erosive duodenitis involving the bulb and proximal      second portion.   RECOMMENDATIONS:  1. Check H. pylori serologies.  2. Follow-up on KOH prep.  At this point time I am not convinced that  all of her symptoms are secondary to reflux.  We do need to rule      out H pylori here.  She may indeed have symptomatic biliary      dyskinesia.  Further recommendations to follow.      Jonathon Bellows, M.D.  Electronically Signed     RMR/MEDQ  D:  03/07/2007  T:  03/08/2007  Job:  [id number]   cc:   Doreen Beam  Fax: [phone]

## 2010-09-06 NOTE — Op Note (Signed)
NAMEJOELLYN, GRANDT               ACCOUNT NO.:  [id number]   MEDICAL RECORD NO.:  [id number]          PATIENT TYPE:  AMB   LOCATION:  DAY                           FACILITY:  [facility]   PHYSICIAN:  R. Roetta Sessions, M.D. DATE OF BIRTH:  03-20-1961   DATE OF PROCEDURE:  03/02/2008  DATE OF DISCHARGE:                               OPERATIVE REPORT   Ileocolonoscopy with ileocecal valve biopsy.   INDICATIONS FOR PROCEDURE:  A 50 year old lady with recent self-limiting  episode of blood per rectum and diarrhea in the setting of antibiotic  and nonsteroidal drug use.  Colonoscopy is now being done.  Risks,  benefits, and alternatives have been reviewed, questions were answered.  Please see the documentation in the medical record.   PROCEDURE NOTE:  O2 saturation, blood pressure, pulse, and respirations  were monitored throughout the entire procedure.   CONSCIOUS SEDATION:  Versed 4 mg IV and Demerol 100 mg IV in divided  doses.   INSTRUMENTS:  Pentax video chip system.   FINDINGS:  Digital rectal exam revealed no abnormalities.  Endoscopic  findings prep was adequate.  Colon:  Colonic mucosa was surveyed from  the rectosigmoid junction through the left transverse and right colon to  the appendiceal orifice, ileocecal valve, and cecum.  These structures  were well seen and photographed for the record.  From this level, the  scope was slowly withdrawn.  All previously mentioned mucosal surfaces  were again seen.  The terminal ileum was also intubated to 10 cm.  The  patient noted have an 8-mm ulcer on the ileocecal valve with surrounding  erosion, inflammatory changes appeared to be a benign lesion.  Please  see multiple photographs.  The patient also had left-sided diverticula  and colonic mucosa and terminal ileal mucosa appeared entirely normal.  The ileocecal valve ulcer was biopsied.  The scope was pulled down to  the rectum with thorough examination of rectal mucosa, including  retroflexed view of the anal verge demonstrated no abnormalities.  The  patient tolerated the procedure well and was reacted in endoscopy.   IMPRESSION:  1. Normal rectum.  2. Left-sided diverticula, ileocecal valve, ulcer of uncertain      significance, status post biopsy, otherwise unremarkable colonic      mucosa, normal terminal ileum.   I suspect the patient has sustained NSAID injury to her GI tract  recently.  Antibiotics may have compound to the problem.   RECOMMENDATIONS:  1. Avoid aspirin/NSAIDs, use non-aspirin/Tylenol based medications as      needed for analgesia.  2. Diverticulosis literature provided to Ms. Lovell Sheehan.  3. Follow up on path.  4. Further recommendations to follow.      Jonathon Bellows, M.D.  Electronically Signed     RMR/MEDQ  D:  03/02/2008  T:  03/02/2008  Job:  [id number]   cc:   Kirstie Peri, MD  Fax: [phone]

## 2010-09-09 NOTE — Consult Note (Signed)
NAMESHARIAH, ASSAD               ACCOUNT NO.:  [id number]   MEDICAL RECORD NO.:  [id number]          PATIENT TYPE:  OUT   LOCATION:  GYN                          [facility]ACILITY:  [facility]   PHYSICIAN:  John T. Kyla Balzarine, M.D.    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   HISTORY OF PRESENT ILLNESS:  This patient underwent laser vaporization of  multifocal vulvar carcinoma in situ on January 09, 2006.  The patient did  well but when she returned to work had worsening vulvar pain.  She has not  yet resumed intercourse.  Vulvar discharge and discomfort has turned the  corner.  She has a remote history of vulvar condylomas treated with TCA or  podophyllin in the mid 1990s.   PAST MEDICAL HISTORY:  No major comorbidities.   MEDICATIONS:  Headache powder and Silvadene.   ALLERGIES:  None known.   Otherwise negative exam.  Weight 144 pounds, blood pressure 120/64.  Inspection the vulva reveals approximately 80% re-epithelialization of  multiple vulvar laser burns.  No underlying erythema suggesting cellulitis.   ASSESSMENT:  Multifocal vulvar intraepithelial neoplasia III.   PLAN:  The patient can be followed by Dr. Arelia Sneddon at 6 month intervals.  We  would be glad to see her back at anytime on a p.r.n. basis.  We discussed  rinse/__________ Sherrine Maples dry vulvar hygiene      John T. Kyla Balzarine, M.D.  Electronically Signed     JTS/MEDQ  D:  01/30/2006  T:  01/31/2006  Job:  [id number]   cc:   Juluis Mire, M.D.  Fax: [phone]   Telford Nab, R.N.  [address]  [place], [place] [address]

## 2010-09-09 NOTE — Consult Note (Signed)
NAMESTARIA, [name]               ACCOUNT NO.:  [id number]   MEDICAL RECORD NO.:  [id number]          PATIENT TYPE:  OUT   LOCATION:  GYN                          FACILITY:  [facility]   PHYSICIAN:  John T. Kyla Balzarine, M.D.    DATE OF BIRTH:  1961-02-09   DATE OF CONSULTATION:  12/26/2005  DATE OF DISCHARGE:                                   CONSULTATION   NEW PATIENT ENCOUNTER   CHIEF COMPLAINT:  Vulvar carcinoma in situ/VIN-3.   HISTORY OF PRESENT ILLNESS:  This 50 year old nulligravida has been  intermittently treated by a either her family physician or prior  gynecologist with TCA or podophyllin for skin tags or warts since the mid  1990s.  A couple of years ago, one of these was removed surgically and she  is unaware whether pathology was sent.  She noted a white area that  persisted along the mid left labia minora, presented to [facility] and biopsy  revealed VIN-3.  The patient's only symptoms are of slight pruritus, but she  is concerned about skin in the perineum, anterior vulva and adjacent to the  anus.  The patient is sexually active, married, divorced and remarried, and  denies abnormal genital cytology, with most recent Pap smear in July  apparently normal.   PAST MEDICAL HISTORY:  The patient had carpal tunnel treated with open  repair, frequent tension headaches, elective AB.  She is status post  carpal tunnel release and a hand cyst repair.   MEDICATIONS:  Headache powder.   ALLERGIES:  NONE KNOWN.   FAMILY MEDICAL HISTORY:  Maternal her grandmother died of ovarian cancer and  father died of lung cancer.  No known breast malignancies.   PERSONAL AND SOCIAL HISTORY:  Married for a second time, admits 5-10  cigarettes daily and denies ethanol or recreational drug use.   REVIEW OF SYSTEMS:  Otherwise negative with no digital condylomata.   EXAMINATION:  LUNGS:  Fields are clear to percussion and auscultation.  HEART:  Sounds regular rate and rhythm.  ENT:  Benign with  clear oropharynx.  BACK:  There is no back or CVA tenderness.  ABDOMEN:  Soft and benign.  No ascites, mass or organomegaly.  EXTREMITIES:  Full strength and range of motion.  EXTERNAL GENITALIA:  Inspection including acetic acid reveals to small  condylomas anterior to the clitoris, 1 x 2-cm area of leukoplakia with a  healing biopsy site in the mid left labia minora, a similar size,  erythematous lesion to the left of the perineum, and a flat condyloma at  approximately 7 o'clock in relation to the anal canal.  Speculum examination  is unremarkable with minimal support and vagina is clear.  Cervix has no  lesions.  Bimanual and rectovaginal examinations disclose normal uterus and  cervix without adnexal mass.   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   PLAN:  The patient will undergo laser vaporization within the next 2 weeks.      John T. Kyla Balzarine, M.D.  Electronically Signed     JTS/MEDQ  D:  12/26/2005  T:  12/27/2005  Job:  [id number]   cc:   Telford Nab, R.N.  [address]  [place], [place] [address]   Juluis Mire, M.D.  Fax: [phone]

## 2010-09-09 NOTE — Op Note (Signed)
NAMEDANIRA, [name]               ACCOUNT NO.:  [id number]   MEDICAL RECORD NO.:  [id number]          PATIENT TYPE:  AMB   LOCATION:  DAY                          FACILITY:  [facility]   PHYSICIAN:  John T. Kyla Balzarine, M.D.    DATE OF BIRTH:  [birth year]   DATE OF PROCEDURE:  DATE OF DISCHARGE:                                 OPERATIVE REPORT   SURGEON:  Ronita Hipps, MD.   PREOPERATIVE DIAGNOSIS:  Vulvar carcinoma in situ/vulvar intraepithelial  neoplasm III with condylomatous lesions.   POSTOPERATIVE DIAGNOSIS:  Vulvar carcinoma in situ/vulvar intraepithelial  neoplasm III with condylomatous lesions, with perianal condylomata.   PROCEDURE:  Extensive laser vaporization of vulvar and vaginal lesions.   ANESTHESIA:  General with local infiltration of Marcaine   DESCRIPTION OF FINDINGS/INDICATIONS FOR SURGERY:  This 50 year old woman has  a history of multiple treatments with TCA for skin tags of vaginal warts.  VIN III was documented by biopsy.  On examination under anesthesia, she had  small apparent condylomata in the periclitoral region, a 1 x 2 cm area of  leukoplakia in the mid left labia minora, a 1-2 cm area of acetowhite  epithelium to the left of the midline in the perineum and flat condyloma at  7 o'clock in relation to the anal canal as well as acetowhite staining of  the anal canal itself, without extension past the dentate line.  All areas  were ablated to the second surgical plane.   DESCRIPTION OF PROCEDURE:  The patient was prepped and draped in the low  lithotomy position and vulva and anal areas saturated with acetic acid  revealing the findings described above.  The individual lesions were  injected with a 0.25% Marcaine and the handheld CO2 laser was used with a  spot size of 2 mm and setting of 20 watts constant to vaporize individual  lesions and adjacent skin to the depth of the second surgical plane.  Hemostasis  was excellent and the operative site was treated with  Silvadene.  The  patient tolerated the procedure well and was taken to the recovery room in  stable condition.  Sponge and instrument counts correct. Drains, packs,  etc., none. Blood loss nil. Pathology specimens, none.      John T. Kyla Balzarine, M.D.  Electronically Signed     JTS/MEDQ  D:  01/09/2006  T:  01/10/2006  Job:  [id number]   cc:   Juluis Mire, M.D.  Fax: [phone]   Telford Nab, R.N.  [address]  [place], [place] [address]

## 2011-01-31 LAB — KOH PREP: KOH Prep: NONE SEEN

## 2011-02-02 LAB — POCT CARDIAC MARKERS
CKMB, poc: <1 — ABNORMAL LOW
Troponin i, poc: <0.05

## 2011-02-02 LAB — I-STAT 8, (EC8 V) (CONVERTED LAB)
BUN: 6
Bicarbonate: 23.3
Operator id: 265201
TCO2: 24
pCO2, Ven: 31.8 — ABNORMAL LOW
pH, Ven: 7.472 — ABNORMAL HIGH

## 2011-02-02 LAB — CBC
HCT: 43.8
MCHC: 34.3
Platelets: 307
RBC: 4.49
RDW: 12.6

## 2011-02-02 LAB — DIFFERENTIAL
Basophils Relative: 1
Monocytes Absolute: 0.5
Monocytes Relative: 5
Neutro Abs: 7.4

## 2011-02-02 LAB — POCT I-STAT CREATININE: Creatinine, Ser: 0.8

## 2011-02-03 LAB — COMPREHENSIVE METABOLIC PANEL
ALT: 14
AST: 15
AST: 19
Albumin: 3.1 — ABNORMAL LOW
Alkaline Phosphatase: 36 — ABNORMAL LOW
Alkaline Phosphatase: 39
BUN: 5 — ABNORMAL LOW
CO2: 27
Calcium: 9.3
Chloride: 109
Creatinine, Ser: 0.68
GFR calc Af Amer: >60
Glucose, Bld: 96
Potassium: 4.1
Potassium: 4.1
Sodium: 140
Sodium: 141
Total Bilirubin: 0.5
Total Protein: 5.3 — ABNORMAL LOW
Total Protein: 6.2

## 2011-02-03 LAB — CBC
HCT: 42
MCHC: 34.9

## 2011-02-03 LAB — DIFFERENTIAL
Basophils Absolute: 0.1
Basophils Relative: 1
Eosinophils Relative: 2
Lymphocytes Relative: 36
Lymphs Abs: 4.1 — ABNORMAL HIGH
Monocytes Absolute: 0.6
Monocytes Relative: 5
Neutro Abs: 6.3

## 2011-02-03 LAB — LIPASE, BLOOD: Lipase: 18

## 2011-02-03 LAB — URINALYSIS, ROUTINE W REFLEX MICROSCOPIC: Protein, ur: NEGATIVE

## 2011-02-03 LAB — CARDIAC PANEL(CRET KIN+CKTOT+MB+TROPI)
Relative Index: INVALID
Total CK: 46

## 2011-02-03 LAB — D-DIMER, QUANTITATIVE: D-Dimer, Quant: 0.25

## 2011-02-03 LAB — POCT CARDIAC MARKERS: Troponin i, poc: <0.05

## 2012-07-16 ENCOUNTER — Emergency Department (HOSPITAL_COMMUNITY): Payer: Worker's Compensation

## 2012-07-16 ENCOUNTER — Encounter (HOSPITAL_COMMUNITY): Payer: Self-pay | Admitting: *Deleted

## 2012-07-16 ENCOUNTER — Emergency Department (HOSPITAL_COMMUNITY)
Admission: EM | Admit: 2012-07-16 | Discharge: 2012-07-16 | Disposition: A | Discharge status: Home / Self Care | Payer: Worker's Compensation | Attending: Emergency Medicine | Admitting: Emergency Medicine

## 2012-07-16 DIAGNOSIS — S161XXA Strain of muscle, fascia and tendon at neck level, initial encounter: Secondary | ICD-10-CM

## 2012-07-16 DIAGNOSIS — IMO0002 Reserved for concepts with insufficient information to code with codable children: Secondary | ICD-10-CM | POA: Insufficient documentation

## 2012-07-16 DIAGNOSIS — T07XXXA Unspecified multiple injuries, initial encounter: Secondary | ICD-10-CM

## 2012-07-16 DIAGNOSIS — Z23 Encounter for immunization: Secondary | ICD-10-CM | POA: Insufficient documentation

## 2012-07-16 DIAGNOSIS — S139XXA Sprain of joints and ligaments of unspecified parts of neck, initial encounter: Secondary | ICD-10-CM | POA: Insufficient documentation

## 2012-07-16 DIAGNOSIS — S335XXA Sprain of ligaments of lumbar spine, initial encounter: Secondary | ICD-10-CM | POA: Insufficient documentation

## 2012-07-16 DIAGNOSIS — S0990XA Unspecified injury of head, initial encounter: Secondary | ICD-10-CM | POA: Insufficient documentation

## 2012-07-16 DIAGNOSIS — F411 Generalized anxiety disorder: Secondary | ICD-10-CM | POA: Insufficient documentation

## 2012-07-16 DIAGNOSIS — S39012A Strain of muscle, fascia and tendon of lower back, initial encounter: Secondary | ICD-10-CM

## 2012-07-16 DIAGNOSIS — F17200 Nicotine dependence, unspecified, uncomplicated: Secondary | ICD-10-CM | POA: Insufficient documentation

## 2012-07-16 MED ORDER — LORAZEPAM 0.5 MG PO TABS: 1.0000 mg | ORAL_TABLET | Freq: Three times a day (TID) | ORAL | Status: DC | PRN

## 2012-07-16 MED ORDER — LORAZEPAM 1 MG PO TABS
1.0000 mg | ORAL_TABLET | Freq: Once | ORAL | Status: AC
  Administered 2012-07-16: 1 mg via ORAL
  Filled 2012-07-16: qty 1

## 2012-07-16 MED ORDER — TETANUS-DIPHTH-ACELL PERTUSSIS 5-2.5-18.5 LF-MCG/0.5 IM SUSP
0.5000 mL | Freq: Once | INTRAMUSCULAR | Status: AC
  Administered 2012-07-16: 0.5 mL via INTRAMUSCULAR
  Filled 2012-07-16: qty 0.5

## 2012-07-16 MED ORDER — HYDROCODONE-ACETAMINOPHEN 5-325 MG PO TABS: 1.0000 | ORAL_TABLET | ORAL | Status: DC | PRN

## 2012-07-16 MED ORDER — DOUBLE ANTIBIOTIC 500-10000 UNIT/GM EX OINT
TOPICAL_OINTMENT | CUTANEOUS | Status: AC
  Filled 2012-07-16: qty 2

## 2012-07-16 MED ORDER — DOUBLE ANTIBIOTIC 500-10000 UNIT/GM EX OINT
TOPICAL_OINTMENT | Freq: Once | CUTANEOUS | Status: AC
  Administered 2012-07-16: 21:00:00 via TOPICAL
  Filled 2012-07-16: qty 1

## 2012-07-16 MED ORDER — HYDROCODONE-ACETAMINOPHEN 5-325 MG PO TABS: 2.0000 | ORAL_TABLET | ORAL | Status: DC | PRN
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
## 2012-07-16 NOTE — ED Notes (Signed)
Dc instructions reviewed with pt and 2 scripts given. Husband at bsd time of dc.
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
## 2012-07-18 NOTE — ED Provider Notes (Signed)
Medical screening examination/treatment/procedure(s) were performed by non-physician practitioner and as supervising physician I was immediately available for consultation/collaboration. Contrell Ballentine, MD, FACEP   Salvadore Valvano L Teron Blais, MD 07/18/12 2028 

## 2012-09-09 ENCOUNTER — Encounter (INDEPENDENT_AMBULATORY_CARE_PROVIDER_SITE_OTHER): Payer: Self-pay | Admitting: Surgery

## 2012-09-17 ENCOUNTER — Encounter (INDEPENDENT_AMBULATORY_CARE_PROVIDER_SITE_OTHER): Payer: Self-pay | Admitting: Surgery

## 2012-09-17 ENCOUNTER — Ambulatory Visit (INDEPENDENT_AMBULATORY_CARE_PROVIDER_SITE_OTHER): Payer: Worker's Compensation | Admitting: Surgery

## 2012-09-17 VITALS — BP 130/78 | HR 82 | Temp 98.3°F | Resp 16 | Ht 60.0 in | Wt 147.0 lb

## 2012-09-17 DIAGNOSIS — D1739 Benign lipomatous neoplasm of skin and subcutaneous tissue of other sites: Secondary | ICD-10-CM

## 2012-09-17 DIAGNOSIS — D170 Benign lipomatous neoplasm of skin and subcutaneous tissue of head, face and neck: Secondary | ICD-10-CM | POA: Insufficient documentation
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
## 2012-09-17 NOTE — Patient Instructions (Addendum)
Call our surgery schedulers at [phone] when you are ready to schedule the surgery.

## 2012-10-02 ENCOUNTER — Encounter (INDEPENDENT_AMBULATORY_CARE_PROVIDER_SITE_OTHER): Payer: Self-pay

## 2012-11-11 ENCOUNTER — Other Ambulatory Visit (INDEPENDENT_AMBULATORY_CARE_PROVIDER_SITE_OTHER): Payer: Self-pay | Admitting: Surgery

## 2012-11-11 DIAGNOSIS — D1739 Benign lipomatous neoplasm of skin and subcutaneous tissue of other sites: Secondary | ICD-10-CM

## 2012-11-12 ENCOUNTER — Telehealth (INDEPENDENT_AMBULATORY_CARE_PROVIDER_SITE_OTHER): Payer: Self-pay | Admitting: General Surgery

## 2012-11-12 NOTE — Telephone Encounter (Signed)
[facility] for patient making her aware of post op appt scheduled on 12/06/2012. I advised patient to call if this date/time doesn't work or if Dr Corliss Skains advised her to be seen sooner.

## 2012-12-06 ENCOUNTER — Encounter (INDEPENDENT_AMBULATORY_CARE_PROVIDER_SITE_OTHER): Payer: Worker's Compensation | Admitting: Surgery

## 2012-12-16 ENCOUNTER — Encounter (INDEPENDENT_AMBULATORY_CARE_PROVIDER_SITE_OTHER): Payer: Worker's Compensation | Admitting: Surgery

## 2012-12-24 ENCOUNTER — Ambulatory Visit (INDEPENDENT_AMBULATORY_CARE_PROVIDER_SITE_OTHER): Payer: Worker's Compensation | Admitting: Surgery

## 2012-12-24 ENCOUNTER — Encounter (INDEPENDENT_AMBULATORY_CARE_PROVIDER_SITE_OTHER): Payer: Self-pay | Admitting: Surgery

## 2012-12-24 VITALS — BP 110/78 | HR 68 | Temp 98.1°F | Resp 16 | Ht 60.0 in | Wt 146.4 lb

## 2012-12-24 DIAGNOSIS — D1739 Benign lipomatous neoplasm of skin and subcutaneous tissue of other sites: Secondary | ICD-10-CM

## 2012-12-24 DIAGNOSIS — D170 Benign lipomatous neoplasm of skin and subcutaneous tissue of head, face and neck: Secondary | ICD-10-CM

## 2012-12-24 NOTE — Progress Notes (Signed)
Status post excision of lipoma from the scalp on 11/11/12. Pathology confirmed a benign lipoma The incision is well-healed with no sign of infection. No seroma. The patient states that she had minimal pain here. She may follow up with [place] as needed.  Wilmon Arms. Corliss Skains, MD, [facility] Surgery  General/ Trauma Surgery  12/24/2012 11:12 AM

## 2013-01-21 ENCOUNTER — Encounter (INDEPENDENT_AMBULATORY_CARE_PROVIDER_SITE_OTHER): Payer: Self-pay

## 2015-08-10 ENCOUNTER — Encounter (HOSPITAL_COMMUNITY): Payer: Self-pay | Admitting: *Deleted

## 2015-08-10 ENCOUNTER — Emergency Department (HOSPITAL_COMMUNITY)
Admission: EM | Admit: 2015-08-10 | Discharge: 2015-08-10 | Disposition: A | Discharge status: Home / Self Care | Payer: Self-pay | Attending: Emergency Medicine | Admitting: Emergency Medicine

## 2015-08-10 ENCOUNTER — Emergency Department (HOSPITAL_COMMUNITY): Payer: Self-pay

## 2015-08-10 DIAGNOSIS — M79672 Pain in left foot: Secondary | ICD-10-CM | POA: Insufficient documentation

## 2015-08-10 DIAGNOSIS — Z87891 Personal history of nicotine dependence: Secondary | ICD-10-CM | POA: Insufficient documentation

## 2015-08-10 LAB — CBG MONITORING, ED: GLUCOSE-CAPILLARY: 95 mg/dL (ref 65–99)

## 2015-08-10 NOTE — ED Provider Notes (Signed)
CSN: [id number]     Arrival date & time 08/10/15  1011 History   First MD Initiated Contact with Patient 08/10/15 1034     Chief Complaint  Patient presents with  . Foot Pain     (Consider location/radiation/quality/duration/timing/severity/associated sxs/prior Treatment) HPI   KIMBERLYNN BLANDON is a 55 y.o. female who presents to the Emergency Department complaining of sudden onset of left foot pain and bruising.  She state that she was moving around, playing with her dog and felt a sudden, sharp, burning pain to her foot and noticed a bruise to the same area.  Unsure if she may have dropped the dog's ball on her foot.  Pain worse with weight bearing.  She has not taken any medications or applied ice.  Denies fall, swelling, or pain proximal to her foot.  She states that a friend told her that it may be a "blood clot" and she admits to becoming very anxious and "worked up" after hearing that and states that she has similar symptoms every time she gets anxious or nervous.     Past Medical History  Diagnosis Date  . Anxiety    Past Surgical History  Procedure Laterality Date  . Cholecystectomy    . Carpal tunnel release    . Lipoma excision     Family History  Problem Relation Age of Onset  . Cancer Mother     lung  . Cancer Father     lung  . Cancer Maternal Grandmother     ovarian   Social History  Substance Use Topics  . Smoking status: Former Smoker    Quit date: 05/25/2010  . Smokeless tobacco: Never Used  . Alcohol Use: No   OB History    No data available     Review of Systems  Constitutional: Negative for fever and chills.  Respiratory: Negative for shortness of breath.   Cardiovascular: Negative for chest pain.  Gastrointestinal: Negative for nausea, vomiting and abdominal pain.  Genitourinary: Negative for dysuria and difficulty urinating.  Musculoskeletal: Positive for arthralgias (left foot pain). Negative for joint swelling.  Skin: Negative for color  change and wound.  Neurological: Negative for seizures, syncope, weakness, numbness and headaches.  All other systems reviewed and are negative.     Allergies  Asa  Home Medications   Prior to Admission medications   Medication Sig Start Date End Date Taking? Authorizing Provider  Multiple Vitamins-Minerals (MULTIVITAMIN ADULT PO) Take 1 tablet by mouth daily.   Yes Historical Provider, MD   BP 116/70 mmHg  Pulse 70  SpO2 96% Physical Exam  Constitutional: She is oriented to person, place, and time. She appears well-developed and well-nourished. No distress.  HENT:  Head: Normocephalic and atraumatic.  Cardiovascular: Normal rate, regular rhythm, normal heart sounds and intact distal pulses.   Pulmonary/Chest: Effort normal and breath sounds normal. No respiratory distress.  Musculoskeletal: She exhibits tenderness. She exhibits no edema.  Localized tenderness of the lateral left foot.  Single bruise to same.  ROM preserved.  DP pulse is brisk,distal sensation intact.  No edema, erythema, abrasion, bony deformity.  No proximal tenderness.  Compartments soft  Neurological: She is alert and oriented to person, place, and time. She exhibits normal muscle tone. Coordination normal.  Skin: Skin is warm and dry.  Psychiatric: She has a normal mood and affect.  Nursing note and vitals reviewed.   ED Course  Procedures (including critical care time) Labs Review Labs Reviewed  CBG MONITORING, ED  Imaging Review Dg Foot Complete Left  08/10/2015  CLINICAL DATA:  Pain and swelling to top of foot without injury. EXAM: LEFT FOOT - COMPLETE 3+ VIEW COMPARISON:  None. FINDINGS: There is no evidence of fracture or dislocation. There is no evidence of arthropathy or other focal bone abnormality. Soft tissues are unremarkable. IMPRESSION: Negative. Electronically Signed   By: Misty Stanley M.D.   On: 08/10/2015 12:13   I have personally reviewed and evaluated these images and lab results  as part of my medical decision-making.   EKG Interpretation   Date/Time:  Tuesday August 10 2015 12:34:03 EDT Ventricular Rate:  70 PR Interval:  143 QRS Duration: 95 QT Interval:  372 QTC Calculation: 401 R Axis:   77 Text Interpretation:  Sinus rhythm Low voltage, precordial leads No  significant change since last tracing Confirmed by Christy Gentles  MD, [place]  [phone]) on 08/10/2015 12:57:56 PM      MDM   Final diagnoses:  Foot pain, left    Pt states that she was stressed and become very anxious just prior to arrival because someone told her that her symptoms may be related to a blood clot.  She reports hx of near syncopal episodes when she becomes anxious or stressed.  She denies any symptoms at time of my exam except foot pain.  I feel that her symptoms are superficial and likely contusion and not related to DVT.    Pt agrees to symptomatic tx.  Has ACE wrap at home, ice packs.  Use instructions given.  PMD f/u if needed.  She appears stable for d/c.      Kem Parkinson, PA-C 08/12/15 1527  Ripley Fraise, MD 08/12/15 1620

## 2015-08-10 NOTE — ED Notes (Addendum)
Pt reports working in garden and noticed a sudden pain and swelling in left foot. Was told to come to ER by family member right away. Pt. Diaphoretic and states she is going to pass out in triage. BP 65/41 in triage. Pt. Taken to back and placed on stretcher, BP now up to 98/64 and patient reports feeling better. Pt reports having anxiety and doesn't deal well with stress. States she was worried about possibility of "blood clots". States she took a xanax PTA.

## 2015-08-10 NOTE — ED Notes (Addendum)
Pt states she was doing house work this am and felt a sudden sharp pain to top of left foot. Hematoma noted. Pt had near syncopal episode in triage reports feeling sweaty/dizzy in triage-states she has history of same with stress. Pt reports taking xanax pta. Pt alert and oriented at present, cbg 98. VSS. nad noted.

## 2015-08-10 NOTE — Discharge Instructions (Signed)
Cryotherapy  Cryotherapy is when you put ice on your injury. Ice helps lessen pain and puffiness (swelling) after an injury. Ice works the best when you start using it in the first 24 to 48 hours after an injury.  HOME CARE  · Put a dry or damp towel between the ice pack and your skin.  · You may press gently on the ice pack.  · Leave the ice on for no more than 10 to 20 minutes at a time.  · Check your skin after 5 minutes to make sure your skin is okay.  · Rest at least 20 minutes between ice pack uses.  · Stop using ice when your skin loses feeling (numbness).  · Do not use ice on someone who cannot tell you when it hurts. This includes small children and people with memory problems (dementia).  GET HELP RIGHT AWAY IF:  · You have white spots on your skin.  · Your skin turns blue or pale.  · Your skin feels waxy or hard.  · Your puffiness gets worse.  MAKE SURE YOU:   · Understand these instructions.  · Will watch your condition.  · Will get help right away if you are not doing well or get worse.     This information is not intended to replace advice given to you by your health care provider. Make sure you discuss any questions you have with your health care provider.     Document Released: 09/27/2007 Document Revised: 07/03/2011 Document Reviewed: 12/01/2010  Elsevier Interactive Patient Education ©2016 Elsevier Inc.

## 2015-08-10 NOTE — ED Notes (Signed)
Pt reports working outside and noticed.

## 2015-08-24 ENCOUNTER — Other Ambulatory Visit: Payer: Self-pay | Admitting: Obstetrics and Gynecology

## 2015-08-24 DIAGNOSIS — Z1231 Encounter for screening mammogram for malignant neoplasm of breast: Secondary | ICD-10-CM

## 2015-09-16 ENCOUNTER — Ambulatory Visit (HOSPITAL_COMMUNITY): Payer: Self-pay

## 2016-06-20 ENCOUNTER — Other Ambulatory Visit: Payer: Self-pay | Admitting: Obstetrics and Gynecology

## 2016-06-20 DIAGNOSIS — Z1231 Encounter for screening mammogram for malignant neoplasm of breast: Secondary | ICD-10-CM

## 2016-07-11 ENCOUNTER — Ambulatory Visit
Admission: RE | Admit: 2016-07-11 | Discharge: 2016-07-11 | Disposition: A | Discharge status: Home / Self Care | Payer: No Typology Code available for payment source | Source: Ambulatory Visit | Attending: Obstetrics and Gynecology | Admitting: Obstetrics and Gynecology

## 2016-07-11 ENCOUNTER — Encounter (HOSPITAL_COMMUNITY): Payer: Self-pay | Admitting: *Deleted

## 2016-07-11 ENCOUNTER — Ambulatory Visit (HOSPITAL_COMMUNITY)
Admission: RE | Admit: 2016-07-11 | Discharge: 2016-07-11 | Disposition: A | Discharge status: Home / Self Care | Payer: Self-pay | Source: Ambulatory Visit | Attending: Obstetrics and Gynecology | Admitting: Obstetrics and Gynecology

## 2016-07-11 VITALS — BP 142/94 | Temp 98.4°F | Ht 60.0 in | Wt 151.6 lb

## 2016-07-11 DIAGNOSIS — Z1231 Encounter for screening mammogram for malignant neoplasm of breast: Secondary | ICD-10-CM

## 2016-07-11 DIAGNOSIS — Z01419 Encounter for gynecological examination (general) (routine) without abnormal findings: Secondary | ICD-10-CM

## 2016-07-11 HISTORY — DX: Sciatica, unspecified side: M54.30

## 2016-07-11 NOTE — Progress Notes (Addendum)
No complaints today.   Pap Smear: Pap smear completed today. Last Pap smear was 4 years ago and normal per patient. Per patient has no history of an abnormal Pap smear. No Pap smear results are in EPIC.  Physical exam: Breasts Breasts symmetrical. No skin abnormalities bilateral breasts. No nipple retraction bilateral breasts. No nipple discharge bilateral breasts. No lymphadenopathy. No lumps palpated bilateral breasts. No complaints of pain or tenderness on exam. Referred patient to the [facility] for a screening mammogram. Appointment scheduled for Tuesday, July 11, 2016 at 1440.  Pelvic/Bimanual   Ext Genitalia No lesions, no swelling and no discharge observed on external genitalia.         Vagina Vagina pink and normal texture. No lesions or discharge observed in vagina.          Cervix Cervix is present. Cervix pink and of normal texture. No discharge observed. Cervical polyp observed at cervical os. Will refer to the [facility] for follow up.    Uterus Uterus is present and palpable. Uterus in normal position and normal size.        Adnexae Bilateral ovaries present and palpable. No tenderness on palpation.          Rectovaginal No rectal exam completed today since patient had no rectal complaints. No skin abnormalities observed on exam.    Smoking History: Patient has never smoked.  Patient Navigation: Patient education provided. Access to services provided for patient through [facility] program.   Colorectal Cancer Screening: Per patient had a colonoscopy completed 7 years ago. No complaints today. FIT Test given to patient to complete and return to [facility].

## 2016-07-11 NOTE — Patient Instructions (Addendum)
Explained breast self awareness with Achille Rich. Let patient know [facility] will cover Pap smears and HPV typing every 5 years unless has a history of abnormal Pap smears. Referred patient to the [facility] for a screening mammogram. Appointment scheduled for Tuesday, July 11, 2016 at 1440. Let patient know will follow up with her within the next couple weeks with results of Pap smear by phone. Told patient will send a referral to the [facility] for the cervical polyp. Informed patient that the Breast will follow up with him within the next couple of weeks with results of mammogram by letter or phone. Achille Rich verbalized understanding.  Denna Fryberger, Arvil Chaco, RN 2:52 PM

## 2016-07-13 ENCOUNTER — Other Ambulatory Visit: Payer: Self-pay | Admitting: Obstetrics and Gynecology

## 2016-07-13 DIAGNOSIS — R928 Other abnormal and inconclusive findings on diagnostic imaging of breast: Secondary | ICD-10-CM

## 2016-07-13 LAB — CYTOLOGY - PAP
DIAGNOSIS: NEGATIVE
HPV (WINDOPATH): NOT DETECTED

## 2016-07-13 NOTE — Addendum Note (Signed)
Encounter addended by: Loletta Parish, RN on: 07/13/2016  9:56 AM<BR>    Actions taken: Follow-up modified, Sign clinical note

## 2016-07-14 ENCOUNTER — Other Ambulatory Visit: Payer: Self-pay

## 2016-07-17 ENCOUNTER — Encounter (HOSPITAL_COMMUNITY): Payer: Self-pay | Admitting: *Deleted

## 2016-07-19 ENCOUNTER — Ambulatory Visit
Admission: RE | Admit: 2016-07-19 | Discharge: 2016-07-19 | Disposition: A | Discharge status: Home / Self Care | Payer: No Typology Code available for payment source | Source: Ambulatory Visit | Attending: Obstetrics and Gynecology | Admitting: Obstetrics and Gynecology

## 2016-07-19 DIAGNOSIS — R928 Other abnormal and inconclusive findings on diagnostic imaging of breast: Secondary | ICD-10-CM

## 2016-07-21 LAB — FECAL OCCULT BLOOD, IMMUNOCHEMICAL: FECAL OCCULT BLD: NEGATIVE

## 2016-07-26 ENCOUNTER — Telehealth (HOSPITAL_COMMUNITY): Payer: Self-pay | Admitting: *Deleted

## 2016-07-26 NOTE — Telephone Encounter (Signed)
Telephoned patient at home number and advised patient Fit Test was negative. Patient voiced understanding.  

## 2016-07-28 ENCOUNTER — Telehealth (HOSPITAL_COMMUNITY): Payer: Self-pay | Admitting: *Deleted

## 2016-07-28 ENCOUNTER — Encounter (HOSPITAL_COMMUNITY): Payer: Self-pay | Admitting: *Deleted

## 2016-07-28 NOTE — Telephone Encounter (Signed)
Patient returned call to [facility]. Advised patient of negative pap smear results. HPV was negative. Next pap smear due in five years.

## 2016-07-28 NOTE — Telephone Encounter (Signed)
Telephoned patient at home number and left message to return call to [facility]. Mailed letter to patient in reference to pap smear results.

## 2016-08-03 ENCOUNTER — Ambulatory Visit (INDEPENDENT_AMBULATORY_CARE_PROVIDER_SITE_OTHER): Payer: Self-pay | Admitting: Obstetrics & Gynecology

## 2016-08-03 ENCOUNTER — Other Ambulatory Visit (HOSPITAL_COMMUNITY)
Admission: RE | Admit: 2016-08-03 | Discharge: 2016-08-03 | Disposition: A | Discharge status: Home / Self Care | Payer: No Typology Code available for payment source | Source: Ambulatory Visit | Attending: Obstetrics & Gynecology | Admitting: Obstetrics & Gynecology

## 2016-08-03 VITALS — BP 131/87 | HR 75 | Ht 60.0 in | Wt 148.0 lb

## 2016-08-03 DIAGNOSIS — N841 Polyp of cervix uteri: Secondary | ICD-10-CM | POA: Insufficient documentation

## 2016-08-03 NOTE — Progress Notes (Signed)
   Subjective:    Patient ID: [name], female    DOB: [birth year], 57 y.o.   MRN: [id number]  HPI  56 yo lady sent here from [facility] for a cervical polyp removal.  Review of Systems  She declines a visit with Jaimie.     Objective:   Physical Exam  [id number] Cervical polyp noted and easily removed after prepping the cervix with betadine. She tolerated the procedure well.      Assessment & Plan:  Cervical polyp- await pathology

## 2016-08-03 NOTE — Progress Notes (Signed)
Patient scored high on the PHQ9 form, and declined to see Roselyn Reef counselor

## 2016-12-27 ENCOUNTER — Other Ambulatory Visit: Payer: Self-pay | Admitting: Obstetrics and Gynecology

## 2016-12-27 ENCOUNTER — Telehealth (HOSPITAL_COMMUNITY): Payer: Self-pay | Admitting: *Deleted

## 2016-12-27 DIAGNOSIS — N6489 Other specified disorders of breast: Secondary | ICD-10-CM

## 2016-12-27 NOTE — Telephone Encounter (Signed)
Spoke with client regarding follow up.

## 2017-01-19 ENCOUNTER — Ambulatory Visit
Admission: RE | Admit: 2017-01-19 | Discharge: 2017-01-19 | Disposition: A | Discharge status: Home / Self Care | Payer: No Typology Code available for payment source | Source: Ambulatory Visit | Attending: Obstetrics and Gynecology | Admitting: Obstetrics and Gynecology

## 2017-01-19 ENCOUNTER — Ambulatory Visit: Payer: Self-pay

## 2017-01-19 DIAGNOSIS — N6489 Other specified disorders of breast: Secondary | ICD-10-CM

## 2017-05-04 DIAGNOSIS — Z79899 Other long term (current) drug therapy: Secondary | ICD-10-CM | POA: Diagnosis not present

## 2017-05-04 DIAGNOSIS — M545 Low back pain: Secondary | ICD-10-CM | POA: Diagnosis not present

## 2017-05-04 DIAGNOSIS — E039 Hypothyroidism, unspecified: Secondary | ICD-10-CM | POA: Diagnosis not present

## 2017-05-04 DIAGNOSIS — G894 Chronic pain syndrome: Secondary | ICD-10-CM | POA: Diagnosis not present

## 2017-05-04 DIAGNOSIS — G8929 Other chronic pain: Secondary | ICD-10-CM | POA: Diagnosis not present

## 2017-05-04 DIAGNOSIS — F419 Anxiety disorder, unspecified: Secondary | ICD-10-CM | POA: Diagnosis not present

## 2017-06-19 DIAGNOSIS — E039 Hypothyroidism, unspecified: Secondary | ICD-10-CM | POA: Diagnosis not present

## 2017-06-19 DIAGNOSIS — F419 Anxiety disorder, unspecified: Secondary | ICD-10-CM | POA: Diagnosis not present

## 2017-07-18 DIAGNOSIS — F4310 Post-traumatic stress disorder, unspecified: Secondary | ICD-10-CM | POA: Diagnosis not present

## 2017-07-18 DIAGNOSIS — M5430 Sciatica, unspecified side: Secondary | ICD-10-CM | POA: Diagnosis not present

## 2017-07-18 DIAGNOSIS — E039 Hypothyroidism, unspecified: Secondary | ICD-10-CM | POA: Diagnosis not present

## 2017-07-18 DIAGNOSIS — F322 Major depressive disorder, single episode, severe without psychotic features: Secondary | ICD-10-CM | POA: Diagnosis not present

## 2017-07-20 DIAGNOSIS — F332 Major depressive disorder, recurrent severe without psychotic features: Secondary | ICD-10-CM | POA: Diagnosis not present

## 2017-08-20 DIAGNOSIS — I361 Nonrheumatic tricuspid (valve) insufficiency: Secondary | ICD-10-CM | POA: Diagnosis not present

## 2017-08-20 DIAGNOSIS — I6523 Occlusion and stenosis of bilateral carotid arteries: Secondary | ICD-10-CM | POA: Diagnosis not present

## 2017-08-20 DIAGNOSIS — I517 Cardiomegaly: Secondary | ICD-10-CM | POA: Diagnosis not present

## 2017-08-20 DIAGNOSIS — R010 Benign and innocent cardiac murmurs: Secondary | ICD-10-CM | POA: Diagnosis not present

## 2017-10-05 DIAGNOSIS — F332 Major depressive disorder, recurrent severe without psychotic features: Secondary | ICD-10-CM | POA: Diagnosis not present

## 2017-10-16 DIAGNOSIS — F419 Anxiety disorder, unspecified: Secondary | ICD-10-CM | POA: Diagnosis not present

## 2017-10-16 DIAGNOSIS — F4310 Post-traumatic stress disorder, unspecified: Secondary | ICD-10-CM | POA: Diagnosis not present

## 2017-10-16 DIAGNOSIS — F322 Major depressive disorder, single episode, severe without psychotic features: Secondary | ICD-10-CM | POA: Diagnosis not present

## 2018-01-07 DIAGNOSIS — F4310 Post-traumatic stress disorder, unspecified: Secondary | ICD-10-CM | POA: Diagnosis not present

## 2018-01-31 ENCOUNTER — Encounter: Payer: Self-pay | Admitting: Internal Medicine

## 2018-02-06 DIAGNOSIS — F419 Anxiety disorder, unspecified: Secondary | ICD-10-CM | POA: Diagnosis not present

## 2018-02-06 DIAGNOSIS — R5383 Other fatigue: Secondary | ICD-10-CM | POA: Diagnosis not present

## 2018-02-06 DIAGNOSIS — K259 Gastric ulcer, unspecified as acute or chronic, without hemorrhage or perforation: Secondary | ICD-10-CM | POA: Diagnosis not present

## 2018-02-06 DIAGNOSIS — F339 Major depressive disorder, recurrent, unspecified: Secondary | ICD-10-CM | POA: Diagnosis not present

## 2018-02-06 DIAGNOSIS — F4310 Post-traumatic stress disorder, unspecified: Secondary | ICD-10-CM | POA: Diagnosis not present

## 2018-02-07 DIAGNOSIS — F332 Major depressive disorder, recurrent severe without psychotic features: Secondary | ICD-10-CM | POA: Diagnosis not present

## 2018-03-11 DIAGNOSIS — E7800 Pure hypercholesterolemia, unspecified: Secondary | ICD-10-CM | POA: Diagnosis not present

## 2018-03-11 DIAGNOSIS — F339 Major depressive disorder, recurrent, unspecified: Secondary | ICD-10-CM | POA: Diagnosis not present

## 2018-03-11 DIAGNOSIS — E785 Hyperlipidemia, unspecified: Secondary | ICD-10-CM | POA: Diagnosis not present

## 2018-03-11 DIAGNOSIS — E559 Vitamin D deficiency, unspecified: Secondary | ICD-10-CM | POA: Diagnosis not present

## 2018-03-11 DIAGNOSIS — E039 Hypothyroidism, unspecified: Secondary | ICD-10-CM | POA: Diagnosis not present

## 2018-03-11 DIAGNOSIS — R300 Dysuria: Secondary | ICD-10-CM | POA: Diagnosis not present

## 2018-03-11 DIAGNOSIS — Z20818 Contact with and (suspected) exposure to other bacterial communicable diseases: Secondary | ICD-10-CM | POA: Diagnosis not present

## 2018-03-11 DIAGNOSIS — K259 Gastric ulcer, unspecified as acute or chronic, without hemorrhage or perforation: Secondary | ICD-10-CM | POA: Diagnosis not present

## 2018-03-11 DIAGNOSIS — N390 Urinary tract infection, site not specified: Secondary | ICD-10-CM | POA: Diagnosis not present

## 2018-03-11 DIAGNOSIS — Z112 Encounter for screening for other bacterial diseases: Secondary | ICD-10-CM | POA: Diagnosis not present

## 2018-03-11 DIAGNOSIS — Z79899 Other long term (current) drug therapy: Secondary | ICD-10-CM | POA: Diagnosis not present

## 2018-03-11 DIAGNOSIS — M5430 Sciatica, unspecified side: Secondary | ICD-10-CM | POA: Diagnosis not present

## 2018-03-11 DIAGNOSIS — Z0000 Encounter for general adult medical examination without abnormal findings: Secondary | ICD-10-CM | POA: Diagnosis not present

## 2018-03-11 DIAGNOSIS — F322 Major depressive disorder, single episode, severe without psychotic features: Secondary | ICD-10-CM | POA: Diagnosis not present

## 2018-03-11 DIAGNOSIS — Z118 Encounter for screening for other infectious and parasitic diseases: Secondary | ICD-10-CM | POA: Diagnosis not present

## 2018-03-11 DIAGNOSIS — R5383 Other fatigue: Secondary | ICD-10-CM | POA: Diagnosis not present

## 2018-03-12 DIAGNOSIS — G603 Idiopathic progressive neuropathy: Secondary | ICD-10-CM | POA: Diagnosis not present

## 2018-03-12 DIAGNOSIS — G608 Other hereditary and idiopathic neuropathies: Secondary | ICD-10-CM | POA: Diagnosis not present

## 2018-03-12 DIAGNOSIS — M79661 Pain in right lower leg: Secondary | ICD-10-CM | POA: Diagnosis not present

## 2018-04-08 DIAGNOSIS — F4310 Post-traumatic stress disorder, unspecified: Secondary | ICD-10-CM | POA: Diagnosis not present

## 2018-04-10 DIAGNOSIS — H524 Presbyopia: Secondary | ICD-10-CM | POA: Diagnosis not present

## 2018-04-10 DIAGNOSIS — Z0100 Encounter for examination of eyes and vision without abnormal findings: Secondary | ICD-10-CM | POA: Diagnosis not present

## 2018-05-24 DIAGNOSIS — F4310 Post-traumatic stress disorder, unspecified: Secondary | ICD-10-CM | POA: Diagnosis not present

## 2018-06-06 DIAGNOSIS — J020 Streptococcal pharyngitis: Secondary | ICD-10-CM | POA: Diagnosis not present

## 2018-06-06 DIAGNOSIS — H9203 Otalgia, bilateral: Secondary | ICD-10-CM | POA: Diagnosis not present

## 2018-06-06 DIAGNOSIS — R05 Cough: Secondary | ICD-10-CM | POA: Diagnosis not present

## 2018-06-06 DIAGNOSIS — R51 Headache: Secondary | ICD-10-CM | POA: Diagnosis not present

## 2018-06-06 DIAGNOSIS — R52 Pain, unspecified: Secondary | ICD-10-CM | POA: Diagnosis not present

## 2018-06-17 DIAGNOSIS — F322 Major depressive disorder, single episode, severe without psychotic features: Secondary | ICD-10-CM | POA: Diagnosis not present

## 2018-06-17 DIAGNOSIS — F419 Anxiety disorder, unspecified: Secondary | ICD-10-CM | POA: Diagnosis not present

## 2018-06-17 DIAGNOSIS — E7800 Pure hypercholesterolemia, unspecified: Secondary | ICD-10-CM | POA: Diagnosis not present

## 2018-06-17 DIAGNOSIS — F4310 Post-traumatic stress disorder, unspecified: Secondary | ICD-10-CM | POA: Diagnosis not present

## 2018-06-17 DIAGNOSIS — E039 Hypothyroidism, unspecified: Secondary | ICD-10-CM | POA: Diagnosis not present

## 2018-06-17 DIAGNOSIS — R7989 Other specified abnormal findings of blood chemistry: Secondary | ICD-10-CM | POA: Diagnosis not present

## 2018-06-17 DIAGNOSIS — M5430 Sciatica, unspecified side: Secondary | ICD-10-CM | POA: Diagnosis not present

## 2018-06-17 DIAGNOSIS — Z8249 Family history of ischemic heart disease and other diseases of the circulatory system: Secondary | ICD-10-CM | POA: Diagnosis not present

## 2018-06-17 DIAGNOSIS — R5383 Other fatigue: Secondary | ICD-10-CM | POA: Diagnosis not present

## 2018-06-17 DIAGNOSIS — Z79899 Other long term (current) drug therapy: Secondary | ICD-10-CM | POA: Diagnosis not present

## 2018-06-17 DIAGNOSIS — R5381 Other malaise: Secondary | ICD-10-CM | POA: Diagnosis not present

## 2018-06-17 DIAGNOSIS — E559 Vitamin D deficiency, unspecified: Secondary | ICD-10-CM | POA: Diagnosis not present

## 2018-06-19 DIAGNOSIS — F4310 Post-traumatic stress disorder, unspecified: Secondary | ICD-10-CM | POA: Diagnosis not present

## 2018-07-18 DIAGNOSIS — F4310 Post-traumatic stress disorder, unspecified: Secondary | ICD-10-CM | POA: Diagnosis not present

## 2019-01-15 ENCOUNTER — Ambulatory Visit: Payer: Self-pay | Admitting: Surgery

## 2019-01-27 ENCOUNTER — Ambulatory Visit: Payer: Self-pay | Admitting: Surgery

## 2019-01-29 ENCOUNTER — Other Ambulatory Visit: Payer: Self-pay

## 2019-01-29 ENCOUNTER — Ambulatory Visit (INDEPENDENT_AMBULATORY_CARE_PROVIDER_SITE_OTHER): Payer: Medicare HMO | Admitting: Surgery

## 2019-01-29 ENCOUNTER — Encounter: Payer: Self-pay | Admitting: Surgery

## 2019-01-29 VITALS — BP 148/90 | HR 71 | Temp 97.9°F | Resp 12 | Ht 60.0 in | Wt 146.0 lb

## 2019-01-29 DIAGNOSIS — M6740 Ganglion, unspecified site: Secondary | ICD-10-CM | POA: Diagnosis not present

## 2019-01-29 NOTE — Patient Instructions (Signed)
Follow-up with our office as needed. Please call and ask to speak with a nurse if you develop questions or concerns.  Ganglion Cyst  A ganglion cyst is a non-cancerous, fluid-filled lump that occurs near a joint or tendon. The cyst grows out of a joint or the lining of a tendon. Ganglion cysts most often develop in the hand or wrist, but they can also develop in the shoulder, elbow, hip, knee, ankle, or foot. Ganglion cysts are ball-shaped or egg-shaped. Their size can range from the size of a pea to larger than a grape. Increased activity may cause the cyst to get bigger because more fluid starts to build up. What are the causes? The exact cause of this condition is not known, but it may be related to:  Inflammation or irritation around the joint.  An injury.  Repetitive movements or overuse.  Arthritis. What increases the risk? You are more likely to develop this condition if:  You are a woman.  You are 85-54 years old. What are the signs or symptoms? The main symptom of this condition is a lump. It most often appears on the hand or wrist. In many cases, there are no other symptoms, but a cyst can sometimes cause:  Tingling.  Pain.  Numbness.  Muscle weakness.  Weak grip.  Less range of motion in a joint. How is this diagnosed? Ganglion cysts are usually diagnosed based on a physical exam. Your health care provider will feel the lump and may shine a light next to it. If it is a ganglion cyst, the light will likely shine through it. Your health care provider may order an X-ray, ultrasound, or MRI to rule out other conditions. How is this treated? Ganglion cysts often go away on their own without treatment. If you have pain or other symptoms, treatment may be needed. Treatment is also needed if the ganglion cyst limits your movement or if it gets infected. Treatment may include:  Wearing a brace or splint on your wrist or finger.  Taking anti-inflammatory medicine.   Having fluid drained from the lump with a needle (aspiration).  Getting a steroid injected into the joint.  Having surgery to remove the ganglion cyst.  Placing a pad on your shoe or wearing shoes that will not rub against the cyst if it is on your foot. Follow these instructions at home:  Do not press on the ganglion cyst, poke it with a needle, or hit it.  Take over-the-counter and prescription medicines only as told by your health care provider.  If you have a brace or splint: ? Wear it as told by your health care provider. ? Remove it as told by your health care provider. Ask if you need to remove it when you take a shower or a bath.  Watch your ganglion cyst for any changes.  Keep all follow-up visits as told by your health care provider. This is important. Contact a health care provider if:  Your ganglion cyst becomes larger or more painful.  You have pus coming from the lump.  You have weakness or numbness in the affected area.  You have a fever or chills. Get help right away if:  You have a fever and have any of these in the cyst area: ? Increased redness. ? Red streaks. ? Swelling. Summary  A ganglion cyst is a non-cancerous, fluid-filled lump that occurs near a joint or tendon.  Ganglion cysts most often develop in the hand or wrist, but they can  also develop in the shoulder, elbow, hip, knee, ankle, or foot.  Ganglion cysts often go away on their own without treatment. This information is not intended to replace advice given to you by your health care provider. Make sure you discuss any questions you have with your health care provider. Document Released: 04/07/2000 Document Revised: 03/23/2017 Document Reviewed: 12/08/2016 Elsevier Patient Education  2020 [employer].

## 2019-02-04 NOTE — Progress Notes (Signed)
Patient ID: [name], female   DOB: [birth year], 58 y.o.   MRN: [id number]  HPI [name] is a 58 y.o. female seen for a left breast cyst.  She has had that for several months now and is more noticeable.  She denies any specific pain.  No previous surgical excision.  No fevers no chills she has normal and intact motor function to the left arm and hand.  HPI  Past Medical History:  Diagnosis Date  . Anxiety   . Sciatica     Past Surgical History:  Procedure Laterality Date  . CARPAL TUNNEL RELEASE    . CHOLECYSTECTOMY    . LIPOMA EXCISION      Family History  Problem Relation Age of Onset  . Cancer Mother        lung  . Cancer Father        lung  . Cancer Maternal Grandmother        ovarian    Social History Social History   Tobacco Use  . Smoking status: Former Smoker    Quit date: 05/25/2010    Years since quitting: 8.7  . Smokeless tobacco: Never Used  Substance Use Topics  . Alcohol use: No  . Drug use: No    Allergies  Allergen Reactions  . Asa [Aspirin]     Current Outpatient Medications  Medication Sig Dispense Refill  . citalopram (CELEXA) 10 MG tablet TK 1 T PO QD    . ezetimibe (ZETIA) 10 MG tablet TK 1 T PO QD    . levothyroxine (SYNTHROID, LEVOTHROID) 50 MCG tablet Take 50 mcg by mouth daily before breakfast.    . meloxicam (MOBIC) 7.5 MG tablet TK 1 T PO BID PRN    . Multiple Vitamins-Minerals (MULTIVITAMIN ADULT PO) Take 1 tablet by mouth daily.    . traMADol (ULTRAM) 50 MG tablet Take by mouth every 6 (six) hours as needed.     No current facility-administered medications for this visit.      Review of Systems Full ROS  was asked and was negative except for the information on the HPI  Physical Exam Blood pressure (!) 148/90, pulse 71, temperature 97.9 F (36.6 C), temperature source Temporal, resp. rate 12, height 5' (1.524 m), weight 146 lb (66.2 kg), last menstrual period 07/16/2012, SpO2 99 %. CONSTITUTIONAL: NAD EYES:  Pupils are equal, round, Sclera are non-icteric. EARS, NOSE, MOUTH AND THROAT:  The oral mucosa is pink and moist. Hearing is intact to voice. MUSCULOSKELETAL: Normal muscle strength and tone. No cyanosis or edema.   There is evidence of 8 mm cyst on left wrist , anterior aspect. No deformity.  SKIN: Turgor is good and there are no pathologic skin lesions or ulcers. NEUROLOGIC: Motor and sensation is grossly normal. Cranial nerves are grossly intact. PSYCH:  Oriented to person, place and time. Affect is normal.  Data Reviewed  I have personally reviewed the patient's imaging, laboratory findings and medical records.    Assessment/Plan Ganglion cyst of the left wrist.  This is diminutive.  I do not think that it requires excision.  Discussed with the patient in detail about her disease process.  She wishes just to watch and see.  No need for surgical intervention at this time.  Specific explained to her the high recurrence rate with excision.   Caroleen Hamman, MD FACS General Surgeon 02/04/2019, 6:49 PM

## 2021-02-10 ENCOUNTER — Other Ambulatory Visit: Payer: Self-pay

## 2021-02-10 ENCOUNTER — Ambulatory Visit: Payer: Medicare HMO | Admitting: Podiatry

## 2021-02-10 DIAGNOSIS — B351 Tinea unguium: Secondary | ICD-10-CM | POA: Diagnosis not present

## 2021-02-10 DIAGNOSIS — Z79899 Other long term (current) drug therapy: Secondary | ICD-10-CM | POA: Diagnosis not present

## 2021-02-10 MED ORDER — TERBINAFINE HCL 250 MG PO TABS: 250.0000 mg | ORAL_TABLET | Freq: Every day | ORAL | 0 refills | Status: AC

## 2021-02-10 NOTE — Progress Notes (Signed)
  Subjective:  Patient ID: [name], female    DOB: 29-Apr-1960,  MRN: [id number]  Chief Complaint  Patient presents with   Ingrown Toenail    Right hallux     60 y.o. female presents with the above complaint.  Patient presents with bilateral hallux thickened elongated dystrophic nail that has been starting to grow ingrown.  Patient states painful to touch.  She would like to discuss treatment options for nail fungus.  She has not seen anyone as prior to seeing me.  She has not tried any oral medication.  She has not tried any topical medication.  She denies any other acute complaints.   Review of Systems: Negative except as noted in the HPI. Denies N/V/F/Ch.  Past Medical History:  Diagnosis Date   Anxiety    Sciatica     Current Outpatient Medications:    terbinafine (LAMISIL) 250 MG tablet, Take 1 tablet (250 mg total) by mouth daily., Disp: 90 tablet, Rfl: 0   citalopram (CELEXA) 10 MG tablet, TK 1 T PO QD, Disp: , Rfl:    ezetimibe (ZETIA) 10 MG tablet, TK 1 T PO QD, Disp: , Rfl:    levothyroxine (SYNTHROID, LEVOTHROID) 50 MCG tablet, Take 50 mcg by mouth daily before breakfast., Disp: , Rfl:    meloxicam (MOBIC) 7.5 MG tablet, TK 1 T PO BID PRN, Disp: , Rfl:    Multiple Vitamins-Minerals (MULTIVITAMIN ADULT PO), Take 1 tablet by mouth daily., Disp: , Rfl:    traMADol (ULTRAM) 50 MG tablet, Take by mouth every 6 (six) hours as needed., Disp: , Rfl:   Social History   Tobacco Use  Smoking Status Former   Types: Cigarettes   Quit date: 05/25/2010   Years since quitting: 10.7  Smokeless Tobacco Never    Allergies  Allergen Reactions   Asa [Aspirin]    Objective:  There were no vitals filed for this visit. There is no height or weight on file to calculate BMI. Constitutional Well developed. Well nourished.  Vascular Dorsalis pedis pulses palpable bilaterally. Posterior tibial pulses palpable bilaterally. Capillary refill normal to all digits.  No cyanosis or  clubbing noted. Pedal hair growth normal.  Neurologic Normal speech. Oriented to person, place, and time. Epicritic sensation to light touch grossly present bilaterally.  Dermatologic Thickened elongated dystrophic toenails x2 bilateral hallux.  Mild pain on palpation.  Orthopedic: Normal joint ROM without pain or crepitus bilaterally. No visible deformities. No bony tenderness.   Radiographs: None Assessment:   1. Onychomycosis due to dermatophyte   2. Encounter for long-term (current) use of high-risk medication   3. Nail fungus    Plan:  Patient was evaluated and treated and all questions answered.  Bilateral hallux onychomycosis -Educated the patient on the etiology of onychomycosis and various treatment options associated with improving the fungal load.  I explained to the patient that there is 3 treatment options available to treat the onychomycosis including topical, p.o., laser treatment.  Patient elected to undergo p.o. options with Lamisil/terbinafine therapy.  -I reviewed the results from her primary care physician which showed normal liver function.  Therefore I sent the Lamisil to her pharmacy   No follow-ups on file.
